# Patient Record
Sex: Female | Born: 1950
Health system: Southern US, Community
[De-identification: ages and names within clinical notes are randomized; demographics above are authoritative.]

## PROBLEM LIST (undated history)

## (undated) DIAGNOSIS — F419 Anxiety disorder, unspecified: Secondary | ICD-10-CM

## (undated) DIAGNOSIS — M1990 Unspecified osteoarthritis, unspecified site: Secondary | ICD-10-CM

## (undated) DIAGNOSIS — G8929 Other chronic pain: Secondary | ICD-10-CM

## (undated) DIAGNOSIS — C801 Malignant (primary) neoplasm, unspecified: Secondary | ICD-10-CM

## (undated) DIAGNOSIS — I10 Essential (primary) hypertension: Secondary | ICD-10-CM

## (undated) DIAGNOSIS — E7800 Pure hypercholesterolemia, unspecified: Secondary | ICD-10-CM

## (undated) DIAGNOSIS — I517 Cardiomegaly: Secondary | ICD-10-CM

## (undated) HISTORY — PX: KNEE SURGERY: SHX244

## (undated) HISTORY — PX: ABDOMINAL HYSTERECTOMY: SHX81

## (undated) HISTORY — PX: BACK SURGERY: SHX140

## (undated) HISTORY — PX: SHOULDER SURGERY: SHX246

---

## 2004-06-16 ENCOUNTER — Ambulatory Visit: Payer: Self-pay | Admitting: Pain Medicine

## 2004-06-24 ENCOUNTER — Ambulatory Visit: Payer: Self-pay | Admitting: Pain Medicine

## 2004-06-26 ENCOUNTER — Ambulatory Visit: Payer: Self-pay | Admitting: Pain Medicine

## 2004-07-14 ENCOUNTER — Ambulatory Visit: Payer: Self-pay | Admitting: Unknown Physician Specialty

## 2004-07-14 ENCOUNTER — Other Ambulatory Visit: Payer: Self-pay

## 2004-07-16 ENCOUNTER — Ambulatory Visit: Payer: Self-pay | Admitting: Unknown Physician Specialty

## 2004-08-06 ENCOUNTER — Ambulatory Visit: Payer: Self-pay | Admitting: Pain Medicine

## 2004-08-24 ENCOUNTER — Ambulatory Visit: Payer: Self-pay | Admitting: Pain Medicine

## 2004-08-31 ENCOUNTER — Ambulatory Visit: Payer: Self-pay | Admitting: Pain Medicine

## 2004-10-07 ENCOUNTER — Ambulatory Visit: Payer: Self-pay | Admitting: Pain Medicine

## 2004-10-19 ENCOUNTER — Ambulatory Visit: Payer: Self-pay | Admitting: Pain Medicine

## 2004-10-27 ENCOUNTER — Ambulatory Visit: Payer: Self-pay | Admitting: Pain Medicine

## 2004-10-28 ENCOUNTER — Ambulatory Visit: Payer: Self-pay | Admitting: Pain Medicine

## 2004-11-11 ENCOUNTER — Ambulatory Visit: Payer: Self-pay | Admitting: Pain Medicine

## 2004-12-09 ENCOUNTER — Ambulatory Visit: Payer: Self-pay | Admitting: Pain Medicine

## 2004-12-24 ENCOUNTER — Ambulatory Visit: Payer: Self-pay | Admitting: Pain Medicine

## 2004-12-30 ENCOUNTER — Ambulatory Visit: Payer: Self-pay | Admitting: Pain Medicine

## 2005-01-21 ENCOUNTER — Ambulatory Visit: Payer: Self-pay | Admitting: Pain Medicine

## 2005-02-03 ENCOUNTER — Ambulatory Visit: Payer: Self-pay | Admitting: Pain Medicine

## 2005-02-16 ENCOUNTER — Ambulatory Visit: Payer: Self-pay | Admitting: Pain Medicine

## 2005-03-16 ENCOUNTER — Ambulatory Visit: Payer: Self-pay | Admitting: Pain Medicine

## 2005-03-22 ENCOUNTER — Ambulatory Visit: Payer: Self-pay | Admitting: Pain Medicine

## 2005-04-13 ENCOUNTER — Ambulatory Visit: Payer: Self-pay | Admitting: Pain Medicine

## 2005-04-19 ENCOUNTER — Ambulatory Visit: Payer: Self-pay | Admitting: Pain Medicine

## 2005-04-21 ENCOUNTER — Ambulatory Visit: Payer: Self-pay | Admitting: Pain Medicine

## 2005-05-11 ENCOUNTER — Ambulatory Visit: Payer: Self-pay | Admitting: Pain Medicine

## 2005-05-17 ENCOUNTER — Ambulatory Visit: Payer: Self-pay | Admitting: Pain Medicine

## 2005-06-10 ENCOUNTER — Ambulatory Visit: Payer: Self-pay | Admitting: Pain Medicine

## 2005-07-06 ENCOUNTER — Ambulatory Visit: Payer: Self-pay | Admitting: Pain Medicine

## 2005-08-05 ENCOUNTER — Ambulatory Visit: Payer: Self-pay | Admitting: Pain Medicine

## 2005-08-24 ENCOUNTER — Ambulatory Visit: Payer: Self-pay | Admitting: Pain Medicine

## 2005-10-14 ENCOUNTER — Ambulatory Visit: Payer: Self-pay | Admitting: Pain Medicine

## 2005-11-08 ENCOUNTER — Ambulatory Visit: Payer: Self-pay | Admitting: Pain Medicine

## 2005-11-18 ENCOUNTER — Ambulatory Visit: Payer: Self-pay | Admitting: Pain Medicine

## 2005-12-01 ENCOUNTER — Ambulatory Visit: Payer: Self-pay | Admitting: Pain Medicine

## 2006-01-04 ENCOUNTER — Ambulatory Visit: Payer: Self-pay | Admitting: Pain Medicine

## 2006-02-01 ENCOUNTER — Ambulatory Visit: Payer: Self-pay | Admitting: Pain Medicine

## 2006-02-03 ENCOUNTER — Other Ambulatory Visit: Payer: Self-pay

## 2006-02-10 ENCOUNTER — Inpatient Hospital Stay: Payer: Self-pay | Admitting: General Practice

## 2006-04-07 ENCOUNTER — Ambulatory Visit: Payer: Self-pay | Admitting: Pain Medicine

## 2006-04-27 ENCOUNTER — Ambulatory Visit: Payer: Self-pay | Admitting: Pain Medicine

## 2006-05-05 ENCOUNTER — Ambulatory Visit: Payer: Self-pay | Admitting: Pain Medicine

## 2006-06-02 ENCOUNTER — Ambulatory Visit: Payer: Self-pay | Admitting: Pain Medicine

## 2006-07-05 ENCOUNTER — Ambulatory Visit: Payer: Self-pay | Admitting: Pain Medicine

## 2006-07-18 ENCOUNTER — Ambulatory Visit: Payer: Self-pay | Admitting: Pain Medicine

## 2006-08-03 ENCOUNTER — Ambulatory Visit: Payer: Self-pay | Admitting: Pain Medicine

## 2006-09-01 ENCOUNTER — Ambulatory Visit: Payer: Self-pay | Admitting: Pain Medicine

## 2006-09-29 ENCOUNTER — Ambulatory Visit: Payer: Self-pay | Admitting: Pain Medicine

## 2006-11-03 ENCOUNTER — Ambulatory Visit: Payer: Self-pay | Admitting: Pain Medicine

## 2006-11-09 ENCOUNTER — Ambulatory Visit: Payer: Self-pay | Admitting: Pain Medicine

## 2006-11-29 ENCOUNTER — Ambulatory Visit: Payer: Self-pay | Admitting: Pain Medicine

## 2006-12-07 ENCOUNTER — Ambulatory Visit: Payer: Self-pay | Admitting: Pain Medicine

## 2007-01-03 ENCOUNTER — Ambulatory Visit: Payer: Self-pay | Admitting: Pain Medicine

## 2007-02-02 ENCOUNTER — Ambulatory Visit: Payer: Self-pay | Admitting: Pain Medicine

## 2007-03-02 ENCOUNTER — Ambulatory Visit: Payer: Self-pay | Admitting: Pain Medicine

## 2007-03-02 ENCOUNTER — Emergency Department: Payer: Self-pay | Admitting: Emergency Medicine

## 2007-03-02 ENCOUNTER — Other Ambulatory Visit: Payer: Self-pay

## 2007-03-08 ENCOUNTER — Ambulatory Visit: Payer: Self-pay | Admitting: Emergency Medicine

## 2007-03-20 ENCOUNTER — Ambulatory Visit: Payer: Self-pay | Admitting: Pain Medicine

## 2007-04-04 ENCOUNTER — Ambulatory Visit: Payer: Self-pay | Admitting: Pain Medicine

## 2007-04-19 ENCOUNTER — Ambulatory Visit: Payer: Self-pay | Admitting: Pain Medicine

## 2007-05-04 ENCOUNTER — Ambulatory Visit: Payer: Self-pay | Admitting: Pain Medicine

## 2007-06-07 ENCOUNTER — Ambulatory Visit: Payer: Self-pay | Admitting: Pain Medicine

## 2007-06-24 ENCOUNTER — Emergency Department: Payer: Self-pay | Admitting: Unknown Physician Specialty

## 2007-07-11 ENCOUNTER — Ambulatory Visit: Payer: Self-pay | Admitting: Pain Medicine

## 2007-08-03 ENCOUNTER — Ambulatory Visit: Payer: Self-pay | Admitting: Pain Medicine

## 2007-08-29 ENCOUNTER — Ambulatory Visit: Payer: Self-pay | Admitting: Pain Medicine

## 2007-09-04 ENCOUNTER — Ambulatory Visit: Payer: Self-pay | Admitting: Pain Medicine

## 2007-10-05 ENCOUNTER — Ambulatory Visit: Payer: Self-pay | Admitting: Pain Medicine

## 2007-10-16 ENCOUNTER — Ambulatory Visit: Payer: Self-pay | Admitting: Pain Medicine

## 2007-10-31 ENCOUNTER — Ambulatory Visit: Payer: Self-pay | Admitting: Pain Medicine

## 2007-11-08 ENCOUNTER — Ambulatory Visit: Payer: Self-pay | Admitting: Pain Medicine

## 2007-11-15 ENCOUNTER — Ambulatory Visit: Payer: Self-pay | Admitting: Pain Medicine

## 2007-11-20 ENCOUNTER — Ambulatory Visit: Payer: Self-pay | Admitting: Pain Medicine

## 2007-12-05 ENCOUNTER — Ambulatory Visit: Payer: Self-pay | Admitting: Pain Medicine

## 2007-12-13 ENCOUNTER — Ambulatory Visit: Payer: Self-pay | Admitting: Pain Medicine

## 2008-01-01 ENCOUNTER — Encounter: Admission: RE | Admit: 2008-01-01 | Discharge: 2008-01-01 | Payer: Self-pay | Admitting: Neurological Surgery

## 2008-01-11 ENCOUNTER — Ambulatory Visit: Payer: Self-pay | Admitting: Pain Medicine

## 2008-02-08 ENCOUNTER — Ambulatory Visit: Payer: Self-pay | Admitting: Pain Medicine

## 2008-02-15 ENCOUNTER — Inpatient Hospital Stay (HOSPITAL_COMMUNITY): Admission: RE | Admit: 2008-02-15 | Discharge: 2008-02-17 | Payer: Self-pay | Admitting: Neurological Surgery

## 2008-03-19 ENCOUNTER — Encounter: Admission: RE | Admit: 2008-03-19 | Discharge: 2008-03-19 | Payer: Self-pay | Admitting: Neurological Surgery

## 2008-04-01 ENCOUNTER — Encounter: Admission: RE | Admit: 2008-04-01 | Discharge: 2008-04-01 | Payer: Self-pay | Admitting: Neurological Surgery

## 2008-05-28 ENCOUNTER — Encounter: Admission: RE | Admit: 2008-05-28 | Discharge: 2008-05-28 | Payer: Self-pay | Admitting: Neurological Surgery

## 2008-08-26 ENCOUNTER — Encounter: Admission: RE | Admit: 2008-08-26 | Discharge: 2008-08-26 | Payer: Self-pay | Admitting: Neurological Surgery

## 2008-12-02 ENCOUNTER — Encounter: Admission: RE | Admit: 2008-12-02 | Discharge: 2008-12-02 | Payer: Self-pay | Admitting: Neurological Surgery

## 2008-12-30 ENCOUNTER — Ambulatory Visit: Payer: Self-pay | Admitting: Cardiology

## 2009-03-04 ENCOUNTER — Ambulatory Visit: Payer: Self-pay | Admitting: Internal Medicine

## 2009-03-18 ENCOUNTER — Ambulatory Visit: Payer: Self-pay | Admitting: Pain Medicine

## 2009-03-26 ENCOUNTER — Ambulatory Visit: Payer: Self-pay | Admitting: Pain Medicine

## 2009-04-22 ENCOUNTER — Ambulatory Visit: Payer: Self-pay | Admitting: Pain Medicine

## 2009-04-30 ENCOUNTER — Ambulatory Visit: Payer: Self-pay | Admitting: Pain Medicine

## 2009-05-21 ENCOUNTER — Ambulatory Visit: Payer: Self-pay | Admitting: Pain Medicine

## 2009-06-19 ENCOUNTER — Ambulatory Visit: Payer: Self-pay | Admitting: Pain Medicine

## 2009-07-17 ENCOUNTER — Ambulatory Visit: Payer: Self-pay | Admitting: Pain Medicine

## 2009-07-30 ENCOUNTER — Ambulatory Visit: Payer: Self-pay | Admitting: Pain Medicine

## 2009-08-06 ENCOUNTER — Ambulatory Visit: Payer: Self-pay | Admitting: Pain Medicine

## 2009-08-18 ENCOUNTER — Ambulatory Visit: Payer: Self-pay | Admitting: Pain Medicine

## 2009-09-16 ENCOUNTER — Ambulatory Visit: Payer: Self-pay | Admitting: Pain Medicine

## 2009-10-20 ENCOUNTER — Ambulatory Visit: Payer: Self-pay | Admitting: Pain Medicine

## 2009-10-27 ENCOUNTER — Ambulatory Visit: Payer: Self-pay | Admitting: Pain Medicine

## 2009-11-18 ENCOUNTER — Ambulatory Visit: Payer: Self-pay | Admitting: Pain Medicine

## 2009-12-16 ENCOUNTER — Ambulatory Visit: Payer: Self-pay | Admitting: Pain Medicine

## 2010-01-20 ENCOUNTER — Ambulatory Visit: Payer: Self-pay | Admitting: Pain Medicine

## 2010-02-17 ENCOUNTER — Ambulatory Visit: Payer: Self-pay | Admitting: Pain Medicine

## 2010-02-19 IMAGING — CT CT HEAD WITHOUT CONTRAST
2 series · 16 of 30 positions shown, 20 images · non-contrast
Comparison: none

REASON FOR EXAM: headache
COMMENTS:

PROCEDURE:     CT  - CT HEAD WITHOUT CONTRAST  - June 24, 2007 [DATE]
RESULT:     Comparison: No available comparison exam.
Procedure: CT examination of the head was performed without intravenous
contrast. Collimation is 5 mm.

[Series 2: without · axial · non-contrast · 0.43mm/px · z∈[+1052,+1172]mm · 13 of 29 slices shown, 17 images]
[im 3/29  brain]
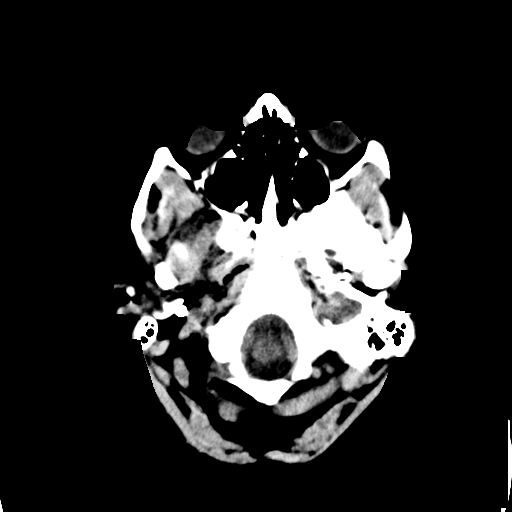
[im 3/29  bone]
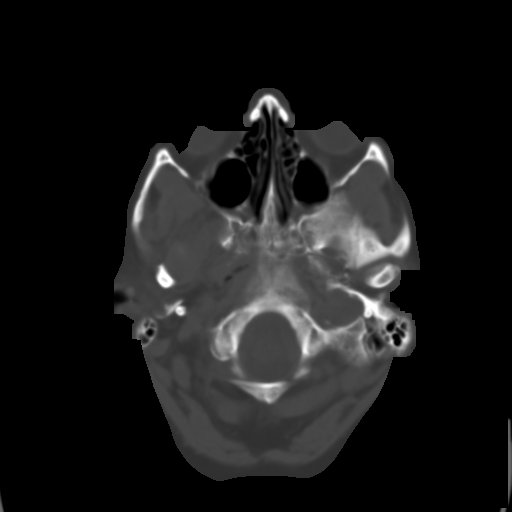
[im 5/29  brain]
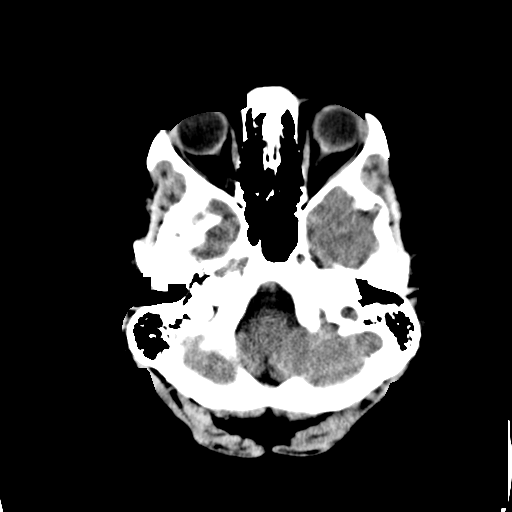
[im 7/29  brain]
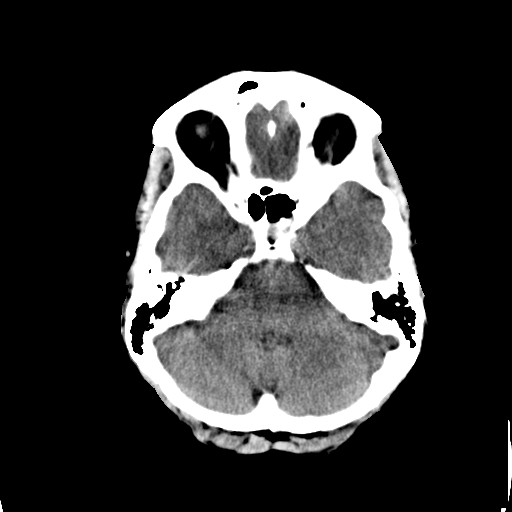
[im 9/29  brain]
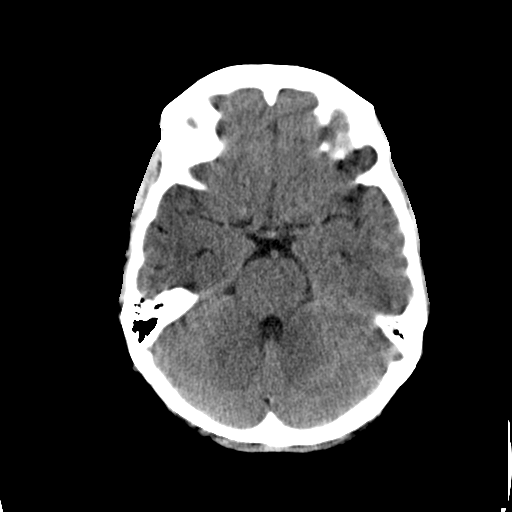
[im 11/29  brain]
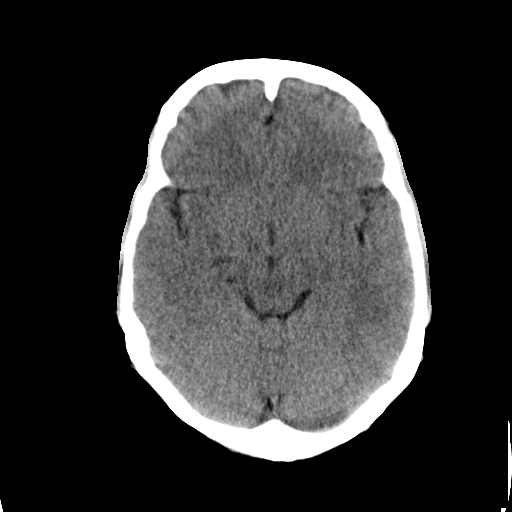
[im 11/29  bone]
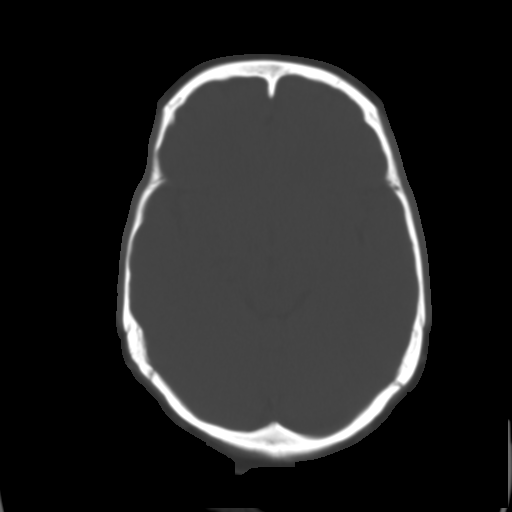
[im 13/29  brain]
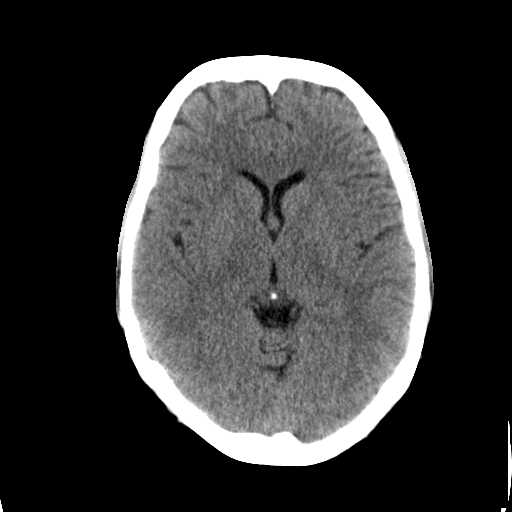
[im 15/29  brain]
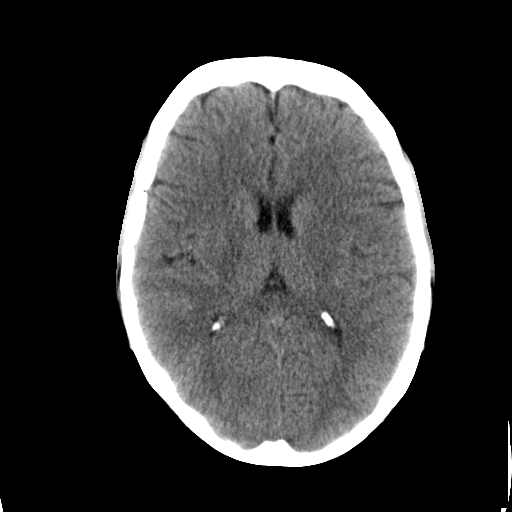
[im 17/29  brain]
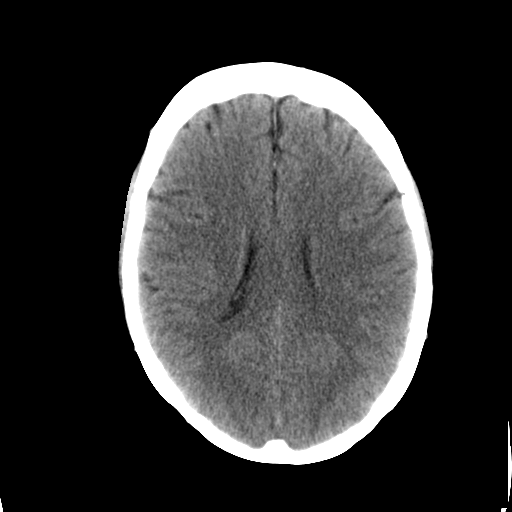
[im 19/29  brain]
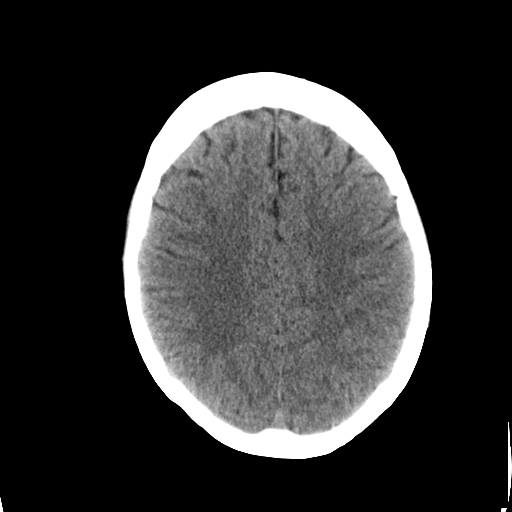
[im 19/29  bone]
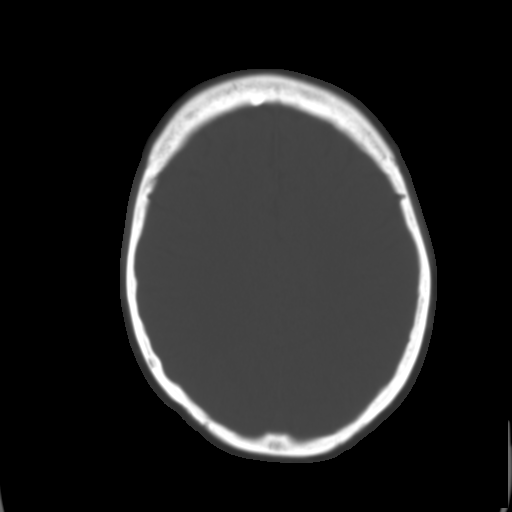
[im 21/29  brain]
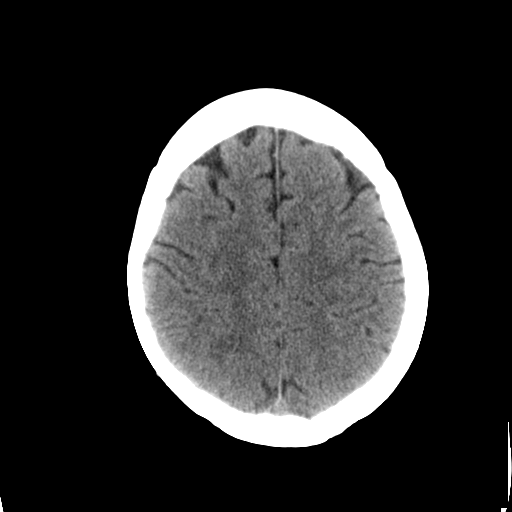
[im 23/29  brain]
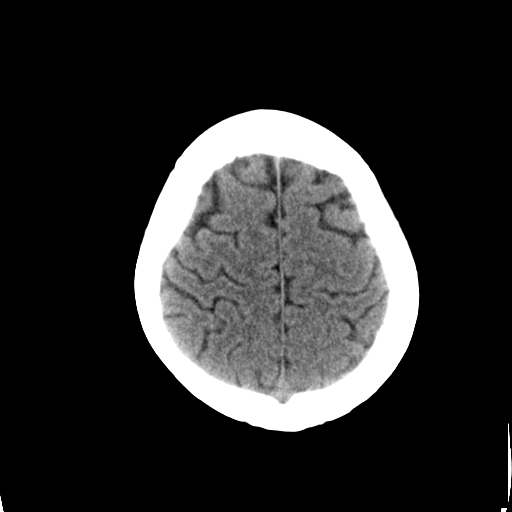
[im 25/29  brain]
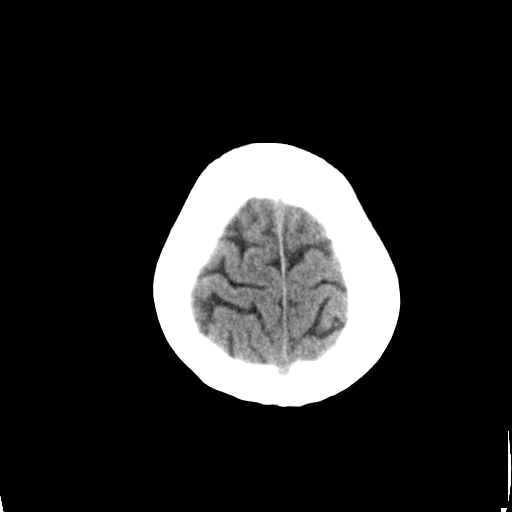
[im 27/29  brain]
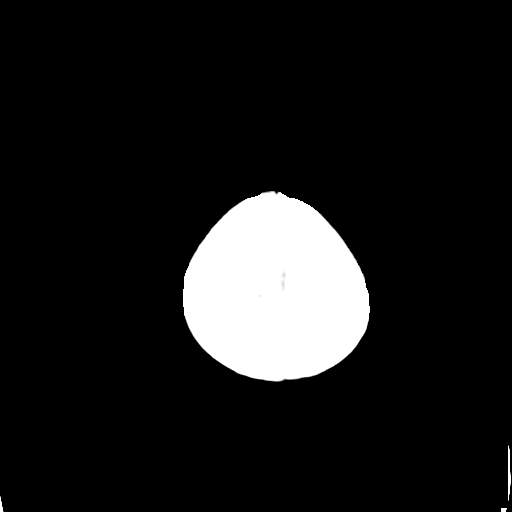
[im 27/29  bone]
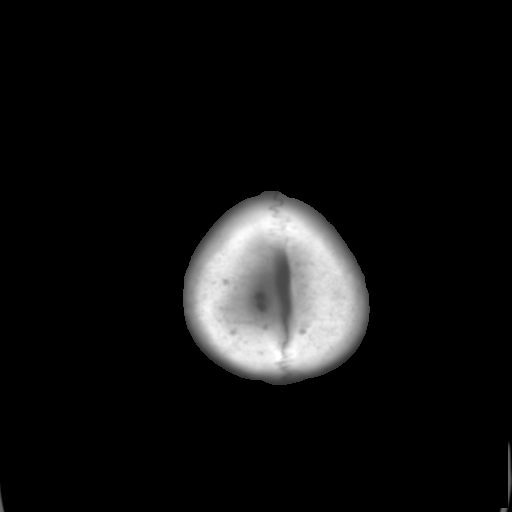

[Series 3: bone · axial · 0.43mm/px · z∈[+1052,+1092]mm · 3 of 29 slices shown]
[im 3/29  bone]
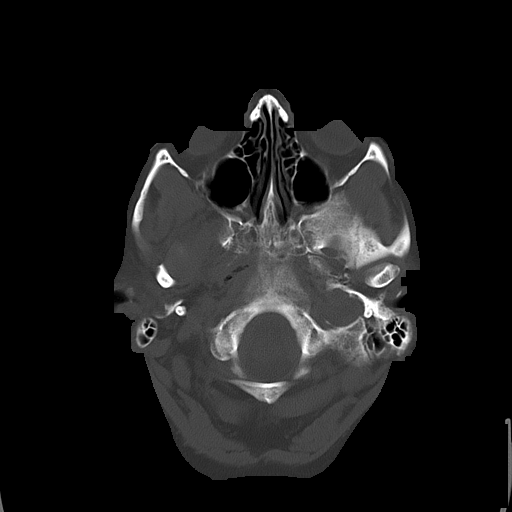
[im 7/29  bone]
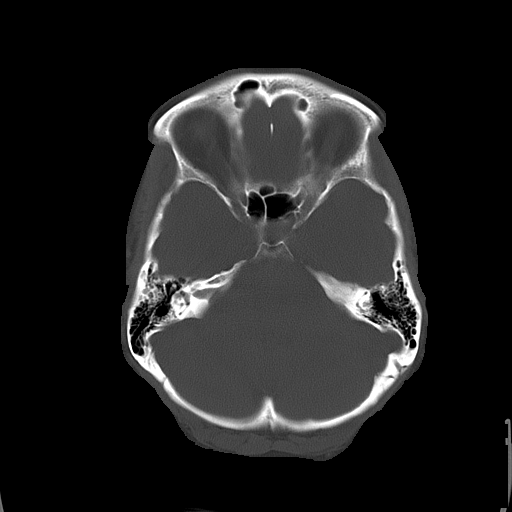
[im 11/29  bone]
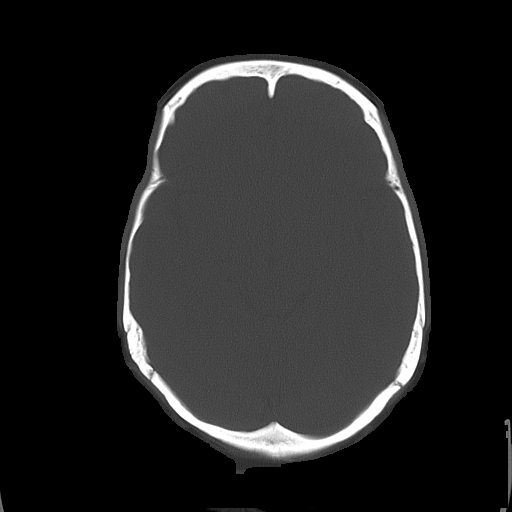

[16 of 30 positions shown; findings below may reference images not displayed]

FINDINGS: No evidence of intracranial hemorrhage, mass-effect, or ventricular
dilatation. The gray and white matters are differentiated. No displaced
calvarial fracture is noted. The partially visualized paranasal sinuses and
mastoid air cells are unremarkable. The left jugular bulb appears high
riding.
IMPRESSION: 1. No acute intracranial abnormality is noted.
2. The left jugular bulb appears high riding.

## 2010-03-17 ENCOUNTER — Ambulatory Visit: Payer: Self-pay | Admitting: Pain Medicine

## 2010-03-25 ENCOUNTER — Ambulatory Visit: Payer: Self-pay | Admitting: Pain Medicine

## 2010-04-07 ENCOUNTER — Ambulatory Visit: Payer: Self-pay | Admitting: Internal Medicine

## 2010-04-14 ENCOUNTER — Ambulatory Visit: Payer: Self-pay | Admitting: Pain Medicine

## 2010-05-11 LAB — TYPE AND SCREEN
ABO/RH(D): O NEG
Antibody Screen: NEGATIVE

## 2010-05-11 LAB — CBC
Hemoglobin: 14 g/dL (ref 12.0–15.0)
MCHC: 33.3 g/dL (ref 30.0–36.0)
MCHC: 34 g/dL (ref 30.0–36.0)
MCV: 90.2 fL (ref 78.0–100.0)
Platelets: 221 10*3/uL (ref 150–400)
RBC: 4.61 MIL/uL (ref 3.87–5.11)
RDW: 12.8 % (ref 11.5–15.5)
RDW: 12.9 % (ref 11.5–15.5)

## 2010-05-11 LAB — BASIC METABOLIC PANEL
CO2: 25 mEq/L (ref 19–32)
Calcium: 9.6 mg/dL (ref 8.4–10.5)
Creatinine, Ser: 0.77 mg/dL (ref 0.4–1.2)
GFR calc Af Amer: >60 mL/min (ref >60)
GFR calc non Af Amer: >60 mL/min (ref >60)
Glucose, Bld: 78 mg/dL (ref 70–99)
Sodium: 137 mEq/L (ref 135–145)

## 2010-05-11 LAB — DIFFERENTIAL
Basophils Absolute: 0.1 10*3/uL (ref 0.0–0.1)
Basophils Relative: 1 % (ref 0–1)
Eosinophils Absolute: 0.2 10*3/uL (ref 0.0–0.7)
Neutro Abs: 4.8 10*3/uL (ref 1.7–7.7)
Neutrophils Relative %: 50 % (ref 43–77)

## 2010-05-11 LAB — PROTIME-INR
INR: 1 (ref 0.00–1.49)
Prothrombin Time: 12.9 seconds (ref 11.6–15.2)

## 2010-05-11 LAB — APTT: aPTT: 25 seconds (ref 24–37)

## 2010-05-14 ENCOUNTER — Ambulatory Visit: Payer: Self-pay | Admitting: Pain Medicine

## 2010-06-09 NOTE — Op Note (Signed)
NAMEBITANIA, SHANKLAND NO.:  [id number]   MEDICAL RECORD NO.:  [id number]          PATIENT TYPE:  INP   LOCATION:  3003                         FACILITY:  [facility]   PHYSICIAN:  Tia Alert, MD     DATE OF BIRTH:  Apr 26, 1950   DATE OF PROCEDURE:  02/15/2008  DATE OF DISCHARGE:                               OPERATIVE REPORT   PREOPERATIVE DIAGNOSES:  1. Severe degenerative disk disease with spondylolisthesis and      stenosis L3-4.  2. Severe degenerative disk disease with stenosis L4-5.   POSTOPERATIVE DIAGNOSES:  1. Severe degenerative disk disease with spondylolisthesis and      stenosis L3-4.  2. Severe degenerative disk disease with stenosis L4-5.   PROCEDURES:  1. Anterior retroperitoneal interbody fusion at L3-4 and L4-5      utilizing a 50-mm PEEK interbody cage packed with Osteocel Plus.  2. Posterior interlaminar fusion at L3-4 and L4-5 utilizing Osteocel      Plus.  3. Interspinous plating at L3-4 and L4-5 utilizing the NuVasive      interspinous plate.   SURGEON:  Tia Alert, MD   ASSISTANT:  Reinaldo Meeker, MD   ANESTHESIA:  General endotracheal.   COMPLICATIONS:  None apparent.   INDICATIONS FOR PROCEDURE:  Ms. Geist is a 60 year old female who has  had a long history of back problems and has been pain management for  several years.  She was referred with severe back pain with progressive  bilateral leg pain.  She had an MRI and plain films which showed severe  degenerative disease at L3-4 and L4-5 with significant collapse of the  disk space with Modic endplate changes, and a grade 1 anterolisthesis of  L3 and L4.  She had stenosis at each level where set L3-4.  She had  tried to medical management for quite some time without significant  relief.  We recommended an anterior retroperitoneal interbody fusion at  L3-4 and L4-5 with interspinous plating.  She understood the risks,  benefits, expected outcome, and wished to  proceed.   DESCRIPTION OF PROCEDURE:  The patient was taken to the operating room,  and after induction of adequate general endotracheal anesthesia, she was  placed in the right lateral decubitus position.  She was monitored with  EMG monitoring.  She was positioned and then tapped into position.  Our  entry points were marked with AP and lateral fluoroscopy.  An extreme  lateral incision was made over the L3-4 interspace with a second  incision just posterior to this.  Blunt finger dissection was used to  dissect into the right retroperitoneal space until the transverse  process was palpated.  I was unable to passed the first dilator to the  psoas musculature.  We hooked this to EMG monitoring and then passed it  through the psoas to the disk space level and then used sequential  dilation attaching each dilator to the EMG monitoring.  Internal final  retractor was in place.  We then opened our retractor sweep away any  overlying psoas musculature, place our shim retractor  after monitoring  where the shim would be placed and then incised the disk space,  performed initial diskectomy with pituitary rongeurs, then used Cobb  dissector to dissect along each endplate and opened the contralateral  annulus.  We then able to distract the disk space to 8 mm and put our  trial into place.  We ordered an 8 mm x 50 mm cage.  We packed this cage  with Osteocel Plus and tapped this into position at L3-4.  We then  checked this with AP and lateral fluoroscopy and removed our retractor.  We then palpated into the retroperitoneal space again, made a new  incision of the L4-5 interspace and again used the same sequential  dilation and EMG monitoring to dilate the psoas musculature and expose  the L4-5 disk space.  We then placed our shim retractor after nerve  monitoring.  Once the retractor was opened, we then cut the disk space  sharply and then performed an initial diskectomy with pituitary  rongeurs,  then cleared the endplates with a Cobb dissector and opened  the contralateral annulus and then distracted the disk space once again  with the trials.  We then used another 8 x 50 mm PEEK interbody cage  packed with Osteocel and tapped this into position at L4-5.  I then  checked this with AP and lateral fluoroscopy and removed our retractor.  I then irrigated these incisions and closed these in layers of 2-0 and 3-  0 Vicryl.  Benzoin and Steri-Strips were placed on the skin and sterile  dressings were applied.  We then turned the patient into the prone  position and prepped her lumbar region with DuraPrep and draped in the  usual sterile fashion.  A 5 mL of local anesthesia was injected and a  dorsal midline incision was made and carried down to the lumbosacral  fascia.  The fascia was opened and the paraspinous musculature was taken  down in a subperiosteal fashion to expose L3-4 and L4-5.  Intraoperative  fluoroscopy confirmed my level and then drilled the lamina and laid a  mixture of Osteocel and Actifuse putty out over these to perform  interlaminar fusion posteriorly at L3-4 and L4-5, then used stackable  interspinous plates at N6-2 and L4-5 squeezed these into position and  then checked my construct with AP and lateral fluoroscopy.  We then  irrigated with saline solution containing bacitracin and placed a medium  Hemovac drain through a separate stab incision and then closed the  fascia with 0 Vicryl, closed the subcutaneous tissue with 2-0 Vicryl,  and the subcuticular tissue with 3-0 Vicryl.  The skin was closed with  Benzoin and Steri-Strips.  The drapes were removed.  Sterile dressing  was applied.  The patient was awakened from general anesthesia and  transferred to the recovery room in stable condition.  At end of the  procedure, all sponge, needle, and instrument counts were correct.      Tia Alert, MD  Electronically Signed     DSJ/MEDQ  D:  02/15/2008  T:   02/16/2008  Job:  [id number]

## 2010-06-11 ENCOUNTER — Ambulatory Visit: Payer: Self-pay | Admitting: Pain Medicine

## 2010-07-16 ENCOUNTER — Ambulatory Visit: Payer: Self-pay | Admitting: Pain Medicine

## 2010-08-12 ENCOUNTER — Ambulatory Visit: Payer: Self-pay | Admitting: Pain Medicine

## 2010-09-10 ENCOUNTER — Ambulatory Visit: Payer: Self-pay | Admitting: Pain Medicine

## 2010-10-13 ENCOUNTER — Ambulatory Visit: Payer: Self-pay | Admitting: Pain Medicine

## 2010-11-02 ENCOUNTER — Ambulatory Visit: Payer: Self-pay | Admitting: Pain Medicine

## 2010-11-12 ENCOUNTER — Ambulatory Visit: Payer: Self-pay | Admitting: Pain Medicine

## 2010-12-03 ENCOUNTER — Ambulatory Visit: Payer: Self-pay | Admitting: Emergency Medicine

## 2010-12-15 ENCOUNTER — Ambulatory Visit: Payer: Self-pay | Admitting: Pain Medicine

## 2011-01-06 ENCOUNTER — Ambulatory Visit: Payer: Self-pay | Admitting: Pain Medicine

## 2011-02-01 ENCOUNTER — Ambulatory Visit: Payer: Self-pay | Admitting: Pain Medicine

## 2011-02-11 ENCOUNTER — Ambulatory Visit: Payer: Self-pay | Admitting: Emergency Medicine

## 2011-02-11 LAB — CBC WITH DIFFERENTIAL/PLATELET
Basophil #: 0 10*3/uL (ref 0.0–0.1)
Eosinophil #: 0.1 10*3/uL (ref 0.0–0.7)
Lymphocyte %: 40.1 %
MCHC: 33.2 g/dL (ref 32.0–36.0)
Monocyte %: 8.3 %
Platelet: 216 10*3/uL (ref 150–440)
RDW: 13.1 % (ref 11.5–14.5)
WBC: 7.3 10*3/uL (ref 3.6–11.0)

## 2011-02-18 ENCOUNTER — Ambulatory Visit: Payer: Self-pay | Admitting: Emergency Medicine

## 2011-03-09 ENCOUNTER — Ambulatory Visit: Payer: Self-pay | Admitting: Pain Medicine

## 2011-04-12 ENCOUNTER — Ambulatory Visit: Payer: Self-pay | Admitting: Pain Medicine

## 2011-05-11 ENCOUNTER — Ambulatory Visit: Payer: Self-pay | Admitting: Pain Medicine

## 2011-06-09 ENCOUNTER — Ambulatory Visit: Payer: Self-pay | Admitting: Pain Medicine

## 2011-07-22 ENCOUNTER — Ambulatory Visit: Payer: Self-pay | Admitting: Internal Medicine

## 2011-07-22 LAB — CREATININE, SERUM
Creatinine: 0.89 mg/dL (ref 0.60–1.30)
EGFR (African American): >60
EGFR (Non-African Amer.): >60

## 2011-09-06 ENCOUNTER — Emergency Department: Payer: Self-pay | Admitting: Emergency Medicine

## 2012-07-04 ENCOUNTER — Ambulatory Visit: Payer: Self-pay | Admitting: General Practice

## 2012-07-04 LAB — BASIC METABOLIC PANEL
BUN: 23 mg/dL — ABNORMAL HIGH (ref 7–18)
Calcium, Total: 9.5 mg/dL (ref 8.5–10.1)
Chloride: 104 mmol/L (ref 98–107)
Co2: 31 mmol/L (ref 21–32)
EGFR (Non-African Amer.): >60
Potassium: 3.7 mmol/L (ref 3.5–5.1)

## 2012-07-04 LAB — URINALYSIS, COMPLETE
Bacteria: NONE SEEN
Bilirubin,UR: NEGATIVE
Glucose,UR: NEGATIVE mg/dL (ref 0–75)
Ketone: NEGATIVE
Nitrite: NEGATIVE
Protein: NEGATIVE
Squamous Epithelial: NONE SEEN

## 2012-07-04 LAB — CBC
HGB: 12.2 g/dL (ref 12.0–16.0)
MCHC: 33.7 g/dL (ref 32.0–36.0)
MCV: 89 fL (ref 80–100)
RBC: 4.04 10*6/uL (ref 3.80–5.20)
RDW: 13.2 % (ref 11.5–14.5)

## 2012-07-04 LAB — APTT: Activated PTT: 30 secs (ref 23.6–35.9)

## 2012-07-05 LAB — URINE CULTURE

## 2012-07-17 ENCOUNTER — Inpatient Hospital Stay: Payer: Self-pay | Admitting: General Practice

## 2012-07-18 LAB — BASIC METABOLIC PANEL
Anion Gap: 5 — ABNORMAL LOW (ref 7–16)
BUN: 18 mg/dL (ref 7–18)
EGFR (African American): >60
Glucose: 90 mg/dL (ref 65–99)
Potassium: 3.8 mmol/L (ref 3.5–5.1)

## 2012-07-18 LAB — PLATELET COUNT: Platelet: 167 10*3/uL (ref 150–440)

## 2012-07-19 LAB — BASIC METABOLIC PANEL
Anion Gap: 7 (ref 7–16)
BUN: 26 mg/dL — ABNORMAL HIGH (ref 7–18)
Co2: 27 mmol/L (ref 21–32)
Sodium: 136 mmol/L (ref 136–145)

## 2012-07-19 LAB — HEMOGLOBIN: HGB: 9.2 g/dL — ABNORMAL LOW (ref 12.0–16.0)

## 2012-10-09 ENCOUNTER — Emergency Department: Payer: Self-pay | Admitting: Emergency Medicine

## 2012-10-26 ENCOUNTER — Ambulatory Visit: Payer: Self-pay | Admitting: Pain Medicine

## 2012-12-09 ENCOUNTER — Emergency Department: Payer: Self-pay | Admitting: Internal Medicine

## 2012-12-13 ENCOUNTER — Ambulatory Visit: Payer: Self-pay | Admitting: Pain Medicine

## 2013-03-01 ENCOUNTER — Ambulatory Visit: Payer: Self-pay | Admitting: Otolaryngology

## 2013-04-09 ENCOUNTER — Ambulatory Visit: Payer: Self-pay | Admitting: Internal Medicine

## 2013-05-28 ENCOUNTER — Emergency Department: Payer: Self-pay | Admitting: Emergency Medicine

## 2013-05-28 LAB — COMPREHENSIVE METABOLIC PANEL
ALT: 46 U/L (ref 12–78)
ANION GAP: 5 — AB (ref 7–16)
AST: 45 U/L — AB (ref 15–37)
Albumin: 4.2 g/dL (ref 3.4–5.0)
Alkaline Phosphatase: 40 U/L — ABNORMAL LOW
BUN: 19 mg/dL — AB (ref 7–18)
Bilirubin,Total: 0.4 mg/dL (ref 0.2–1.0)
CALCIUM: 9.8 mg/dL (ref 8.5–10.1)
CHLORIDE: 106 mmol/L (ref 98–107)
Co2: 30 mmol/L (ref 21–32)
Creatinine: 0.81 mg/dL (ref 0.60–1.30)
EGFR (African American): >60
GLUCOSE: 115 mg/dL — AB (ref 65–99)
Osmolality: 284 (ref 275–301)
Potassium: 3.3 mmol/L — ABNORMAL LOW (ref 3.5–5.1)
SODIUM: 141 mmol/L (ref 136–145)
TOTAL PROTEIN: 7.7 g/dL (ref 6.4–8.2)

## 2013-05-28 LAB — URINALYSIS, COMPLETE
BLOOD: NEGATIVE
Bacteria: NONE SEEN
Bilirubin,UR: NEGATIVE
Glucose,UR: NEGATIVE mg/dL (ref 0–75)
KETONE: NEGATIVE
Leukocyte Esterase: NEGATIVE
Nitrite: NEGATIVE
PROTEIN: NEGATIVE
Ph: 5 (ref 4.5–8.0)
Specific Gravity: 1.017 (ref 1.003–1.030)
Squamous Epithelial: <1
WBC UR: <1 /HPF (ref 0–5)

## 2013-05-28 LAB — CBC
HCT: 38.1 % (ref 35.0–47.0)
HGB: 12.5 g/dL (ref 12.0–16.0)
MCH: 30.2 pg (ref 26.0–34.0)
MCHC: 33 g/dL (ref 32.0–36.0)
MCV: 92 fL (ref 80–100)
Platelet: 205 10*3/uL (ref 150–440)
RBC: 4.15 10*6/uL (ref 3.80–5.20)
RDW: 13.2 % (ref 11.5–14.5)
WBC: 6.8 10*3/uL (ref 3.6–11.0)

## 2013-07-03 ENCOUNTER — Ambulatory Visit: Payer: Self-pay | Admitting: Pain Medicine

## 2013-07-16 ENCOUNTER — Ambulatory Visit: Payer: Self-pay | Admitting: Pain Medicine

## 2013-07-31 ENCOUNTER — Ambulatory Visit: Payer: Self-pay | Admitting: Pain Medicine

## 2013-08-30 ENCOUNTER — Ambulatory Visit: Payer: Self-pay | Admitting: Pain Medicine

## 2013-09-05 ENCOUNTER — Ambulatory Visit: Payer: Self-pay | Admitting: Pain Medicine

## 2013-10-02 ENCOUNTER — Ambulatory Visit: Payer: Self-pay

## 2013-10-02 ENCOUNTER — Ambulatory Visit: Payer: Self-pay | Admitting: Pain Medicine

## 2013-11-07 ENCOUNTER — Ambulatory Visit: Payer: Self-pay | Admitting: Pain Medicine

## 2013-11-22 ENCOUNTER — Ambulatory Visit: Payer: Self-pay | Admitting: Pain Medicine

## 2013-12-06 ENCOUNTER — Ambulatory Visit: Payer: Self-pay | Admitting: Pain Medicine

## 2013-12-10 ENCOUNTER — Ambulatory Visit: Payer: Self-pay | Admitting: Pain Medicine

## 2014-01-03 ENCOUNTER — Ambulatory Visit: Payer: Self-pay | Admitting: Pain Medicine

## 2014-01-23 ENCOUNTER — Ambulatory Visit: Payer: Self-pay | Admitting: Pain Medicine

## 2014-01-28 ENCOUNTER — Ambulatory Visit: Payer: Self-pay | Admitting: Pain Medicine

## 2014-01-28 DIAGNOSIS — M4806 Spinal stenosis, lumbar region: Secondary | ICD-10-CM | POA: Diagnosis not present

## 2014-01-28 DIAGNOSIS — M47896 Other spondylosis, lumbar region: Secondary | ICD-10-CM | POA: Diagnosis not present

## 2014-01-28 DIAGNOSIS — M5126 Other intervertebral disc displacement, lumbar region: Secondary | ICD-10-CM | POA: Diagnosis not present

## 2014-02-25 DIAGNOSIS — I10 Essential (primary) hypertension: Secondary | ICD-10-CM | POA: Diagnosis not present

## 2014-02-25 DIAGNOSIS — E784 Other hyperlipidemia: Secondary | ICD-10-CM | POA: Diagnosis not present

## 2014-02-28 ENCOUNTER — Ambulatory Visit: Admit: 2014-02-28 | Disposition: A | Discharge status: Home / Self Care | Payer: Self-pay | Admitting: Internal Medicine

## 2014-02-28 ENCOUNTER — Ambulatory Visit: Payer: Self-pay | Admitting: Pain Medicine

## 2014-02-28 DIAGNOSIS — M5126 Other intervertebral disc displacement, lumbar region: Secondary | ICD-10-CM | POA: Diagnosis not present

## 2014-02-28 DIAGNOSIS — M47896 Other spondylosis, lumbar region: Secondary | ICD-10-CM | POA: Diagnosis not present

## 2014-02-28 DIAGNOSIS — F1120 Opioid dependence, uncomplicated: Secondary | ICD-10-CM | POA: Diagnosis not present

## 2014-02-28 DIAGNOSIS — G894 Chronic pain syndrome: Secondary | ICD-10-CM | POA: Diagnosis not present

## 2014-02-28 DIAGNOSIS — M5416 Radiculopathy, lumbar region: Secondary | ICD-10-CM | POA: Diagnosis not present

## 2014-02-28 DIAGNOSIS — M533 Sacrococcygeal disorders, not elsewhere classified: Secondary | ICD-10-CM | POA: Diagnosis not present

## 2014-02-28 DIAGNOSIS — Z79899 Other long term (current) drug therapy: Secondary | ICD-10-CM | POA: Diagnosis not present

## 2014-02-28 DIAGNOSIS — M47817 Spondylosis without myelopathy or radiculopathy, lumbosacral region: Secondary | ICD-10-CM | POA: Diagnosis not present

## 2014-02-28 DIAGNOSIS — M792 Neuralgia and neuritis, unspecified: Secondary | ICD-10-CM | POA: Diagnosis not present

## 2014-03-07 DIAGNOSIS — Z0000 Encounter for general adult medical examination without abnormal findings: Secondary | ICD-10-CM | POA: Diagnosis not present

## 2014-03-07 DIAGNOSIS — Z01419 Encounter for gynecological examination (general) (routine) without abnormal findings: Secondary | ICD-10-CM | POA: Diagnosis not present

## 2014-03-08 DIAGNOSIS — M75112 Incomplete rotator cuff tear or rupture of left shoulder, not specified as traumatic: Secondary | ICD-10-CM | POA: Diagnosis not present

## 2014-03-08 DIAGNOSIS — M25512 Pain in left shoulder: Secondary | ICD-10-CM | POA: Diagnosis not present

## 2014-03-14 DIAGNOSIS — M65812 Other synovitis and tenosynovitis, left shoulder: Secondary | ICD-10-CM | POA: Diagnosis not present

## 2014-03-14 DIAGNOSIS — M75122 Complete rotator cuff tear or rupture of left shoulder, not specified as traumatic: Secondary | ICD-10-CM | POA: Diagnosis not present

## 2014-03-28 ENCOUNTER — Ambulatory Visit: Payer: Self-pay | Admitting: Surgery

## 2014-03-28 DIAGNOSIS — Z01812 Encounter for preprocedural laboratory examination: Secondary | ICD-10-CM | POA: Diagnosis not present

## 2014-03-28 DIAGNOSIS — I10 Essential (primary) hypertension: Secondary | ICD-10-CM | POA: Diagnosis not present

## 2014-03-28 DIAGNOSIS — Z01810 Encounter for preprocedural cardiovascular examination: Secondary | ICD-10-CM | POA: Diagnosis not present

## 2014-04-02 ENCOUNTER — Ambulatory Visit: Payer: Self-pay | Admitting: Pain Medicine

## 2014-04-02 DIAGNOSIS — M47817 Spondylosis without myelopathy or radiculopathy, lumbosacral region: Secondary | ICD-10-CM | POA: Diagnosis not present

## 2014-04-02 DIAGNOSIS — M792 Neuralgia and neuritis, unspecified: Secondary | ICD-10-CM | POA: Diagnosis not present

## 2014-04-02 DIAGNOSIS — M5416 Radiculopathy, lumbar region: Secondary | ICD-10-CM | POA: Diagnosis not present

## 2014-04-02 DIAGNOSIS — M25512 Pain in left shoulder: Secondary | ICD-10-CM | POA: Diagnosis not present

## 2014-04-02 DIAGNOSIS — M533 Sacrococcygeal disorders, not elsewhere classified: Secondary | ICD-10-CM | POA: Diagnosis not present

## 2014-04-02 DIAGNOSIS — F329 Major depressive disorder, single episode, unspecified: Secondary | ICD-10-CM | POA: Diagnosis not present

## 2014-04-02 DIAGNOSIS — I10 Essential (primary) hypertension: Secondary | ICD-10-CM | POA: Diagnosis not present

## 2014-04-04 ENCOUNTER — Ambulatory Visit: Payer: Self-pay | Admitting: Surgery

## 2014-04-04 DIAGNOSIS — Z791 Long term (current) use of non-steroidal anti-inflammatories (NSAID): Secondary | ICD-10-CM | POA: Diagnosis not present

## 2014-04-04 DIAGNOSIS — M65812 Other synovitis and tenosynovitis, left shoulder: Secondary | ICD-10-CM | POA: Diagnosis not present

## 2014-04-04 DIAGNOSIS — Z885 Allergy status to narcotic agent status: Secondary | ICD-10-CM | POA: Diagnosis not present

## 2014-04-04 DIAGNOSIS — M75122 Complete rotator cuff tear or rupture of left shoulder, not specified as traumatic: Secondary | ICD-10-CM | POA: Diagnosis not present

## 2014-04-04 DIAGNOSIS — S46212A Strain of muscle, fascia and tendon of other parts of biceps, left arm, initial encounter: Secondary | ICD-10-CM | POA: Diagnosis not present

## 2014-04-04 DIAGNOSIS — M25512 Pain in left shoulder: Secondary | ICD-10-CM | POA: Diagnosis not present

## 2014-04-04 DIAGNOSIS — E785 Hyperlipidemia, unspecified: Secondary | ICD-10-CM | POA: Diagnosis not present

## 2014-04-04 DIAGNOSIS — I10 Essential (primary) hypertension: Secondary | ICD-10-CM | POA: Diagnosis not present

## 2014-04-04 DIAGNOSIS — Z79899 Other long term (current) drug therapy: Secondary | ICD-10-CM | POA: Diagnosis not present

## 2014-04-04 DIAGNOSIS — Z7982 Long term (current) use of aspirin: Secondary | ICD-10-CM | POA: Diagnosis not present

## 2014-04-04 DIAGNOSIS — M75102 Unspecified rotator cuff tear or rupture of left shoulder, not specified as traumatic: Secondary | ICD-10-CM | POA: Diagnosis not present

## 2014-04-04 DIAGNOSIS — Z880 Allergy status to penicillin: Secondary | ICD-10-CM | POA: Diagnosis not present

## 2014-04-04 DIAGNOSIS — G8918 Other acute postprocedural pain: Secondary | ICD-10-CM | POA: Diagnosis not present

## 2014-04-22 DIAGNOSIS — M25612 Stiffness of left shoulder, not elsewhere classified: Secondary | ICD-10-CM | POA: Diagnosis not present

## 2014-04-22 DIAGNOSIS — M25512 Pain in left shoulder: Secondary | ICD-10-CM | POA: Diagnosis not present

## 2014-04-22 DIAGNOSIS — Z9889 Other specified postprocedural states: Secondary | ICD-10-CM | POA: Diagnosis not present

## 2014-04-29 DIAGNOSIS — Z9889 Other specified postprocedural states: Secondary | ICD-10-CM | POA: Diagnosis not present

## 2014-04-29 DIAGNOSIS — M25612 Stiffness of left shoulder, not elsewhere classified: Secondary | ICD-10-CM | POA: Diagnosis not present

## 2014-04-29 DIAGNOSIS — M25512 Pain in left shoulder: Secondary | ICD-10-CM | POA: Diagnosis not present

## 2014-05-06 DIAGNOSIS — I10 Essential (primary) hypertension: Secondary | ICD-10-CM | POA: Diagnosis not present

## 2014-05-06 DIAGNOSIS — M25612 Stiffness of left shoulder, not elsewhere classified: Secondary | ICD-10-CM | POA: Diagnosis not present

## 2014-05-06 DIAGNOSIS — Z9889 Other specified postprocedural states: Secondary | ICD-10-CM | POA: Diagnosis not present

## 2014-05-06 DIAGNOSIS — M25512 Pain in left shoulder: Secondary | ICD-10-CM | POA: Diagnosis not present

## 2014-05-06 DIAGNOSIS — M6281 Muscle weakness (generalized): Secondary | ICD-10-CM | POA: Diagnosis not present

## 2014-05-13 DIAGNOSIS — M25612 Stiffness of left shoulder, not elsewhere classified: Secondary | ICD-10-CM | POA: Diagnosis not present

## 2014-05-13 DIAGNOSIS — M25512 Pain in left shoulder: Secondary | ICD-10-CM | POA: Diagnosis not present

## 2014-05-13 DIAGNOSIS — M6281 Muscle weakness (generalized): Secondary | ICD-10-CM | POA: Diagnosis not present

## 2014-05-13 DIAGNOSIS — Z9889 Other specified postprocedural states: Secondary | ICD-10-CM | POA: Diagnosis not present

## 2014-05-17 NOTE — Discharge Summary (Signed)
PATIENT NAME:  Sandra Dudley, KAREN MR#:  [id number] DATE OF BIRTH:  [birth year]  DATE OF ADMISSION:  07/17/2012 DATE OF DISCHARGE:  07/20/2012  ADMITTING DIAGNOSIS: Degenerative arthrosis of the left knee.   DISCHARGE DIAGNOSIS: Degenerative arthrosis of the left knee.   HISTORY: The patient is a 64 year old female who has been followed at [facility] for progression of left knee pain. The patient had reported a several year history of intermittent left knee pain. More recently the pain had increased, both in severity as well as in consistency. She was having difficulty with arising from a sitting position as well as ascending and descending stairs. The patient had localized most of the pain along the medial aspect of the knee. Her left knee pain was aggravated with weight-bearing activities. She also reported progression of pain to the point that it was significantly interfering with her activities of daily living. The patient had not seen any significant improvement in her condition despite anti-inflammatory medications, aspirin, as well as activity modification. X-rays taken in [place] showed narrowing of the medial cartilage space with bone-on-bone articulation noted as well as being associated with varus alignment. Osteophyte as well as subchondral sclerosis was noted. After discussion of the risks and benefits of surgical intervention, the patient expressed her understanding of the risks and benefits and agreed for plans for surgical intervention.   PROCEDURE: Left total knee arthroplasty using computer-assisted navigation.   ANESTHESIA: Femoral nerve block with spinal.   SOFT TISSUE RELEASE: Anterior cruciate ligament, posterior cruciate ligament, deep and superficial medial collateral ligaments, as well as the patellofemoral ligament.   IMPLANTS UTILIZED: DePuy PFC Sigma size 3 posterior stabilized femoral component (cemented), size 3 MBT tibial component (cemented), 35 mm  three-pegged oval dome patella (cemented), and a 12.5 mm stabilized rotating platform polyethylene insert.   HOSPITAL COURSE: The patient tolerated the procedure very well. She had no complications. She was then taken to PAC-U where she was stabilized and then transferred to the orthopedic floor. The patient began receiving anticoagulation therapy of Lovenox 30 mg subcu q. 12 hours per anesthesia and pharmacy protocol. She was fitted with TED stockings bilaterally. These were allowed to be removed 1 hour per 8 hour shift. The left one was applied on day 2 following removal of the Hemovac and dressing change. The patient was also fitted with the AV-I compression foot pumps bilaterally set at 80 mmHg.  Her calves have been nontender. There has been no evidence of any DVTs to the lower extremity. Negative Homans sign. The heels were elevated off the bed using rolled towels.   The patient has denied any chest pain or shortness of breath. Vital signs have been stable. She has been afebrile. Hemodynamically she was stable and no transfusions were given other than the Autovac transfusion given the first 6 hours postoperatively.   Physical therapy was initiated on day 1 for gait training and transfers. Upon being discharged was ambulating greater than 300 feet. She was able to go up and down 4 sets of steps. Had range of motion of 0 to 100 degrees. Occupational therapy was also initiated on day 1 for ADL and assistive devices. She has progressed very nicely and has had no complications throughout the hospital course.   DISPOSITION: The patient is being discharged to home in improved stable condition.   DISCHARGE INSTRUCTIONS:  1.  She may weight bear as tolerated. Continue using a walker until cleared by physical therapy to go to a quad cane.  She will receive home health PT.  2.  She is to continue using the Polar Care maintaining a temperature of 40 to 50 degrees Fahrenheit. She is to wear this around-the-clock  for the first 2 weeks.  3.  She is placed on a regular diet.  4.  Incentive spirometer q. 1 hour while awake.  5.  She has a follow-up appointment on 07/08 at 2:45. She is to call the clinic sooner if any temperatures of 101.5 or greater or any other problems. 6.  Will change the dressing as needed.   DRUG ALLERGIES: CODEINE, PENICILLIN.   DISCHARGE MEDICATIONS: The patient is to resume her regular medication that she was on prior to admission. She was given a prescription for Roxicodone 5 to 10 mg every 4 to 6 hours p.r.n. for pain, tramadol 50 to 100 mg q. 4 to 6 hours p.r.n. for pain and Lovenox 40 mg subcu q. day for 14 days and then discontinue and begin taking one 81 mg enteric-coated aspirin.   PAST MEDICAL HISTORY: 1.  Hypertension. 2.  Hyperlipidemia.  3.  Seasonal allergies. 4.  Depression and anxiety.  5.  Osteoporosis.  6.  Mitral valve regurgitation.  7.  Sleep apnea.  ____________________________ Vance Peper, PA jrw:sb D: 07/20/2012 07:17:25 ET T: 07/20/2012 07:40:21 ET JOB#: [id number]  cc: Vance Peper, PA, <Dictator> JON WOLFE PA ELECTRONICALLY SIGNED 07/21/2012 21:40

## 2014-05-17 NOTE — Op Note (Signed)
PATIENT NAME:  Sandra Dudley, Sandra Dudley MR#:  [id number] DATE OF BIRTH:  1950/02/27  DATE OF PROCEDURE:  07/17/2012  PREOPERATIVE DIAGNOSIS: Degenerative arthrosis of the left knee.   POSTOPERATIVE DIAGNOSIS: Degenerative arthrosis of the left knee.   PROCEDURE PERFORMED: Left total knee arthroplasty using computer-assisted navigation.   SURGEON: Dr. Skip Estimable.   ASSISTANT:  Vance Peper, PA (required to maintain retraction throughout the procedure).   ANESTHESIA: Femoral nerve block, and spinal.   ESTIMATED BLOOD LOSS: 50 mL.   FLUIDS REPLACED: 1400 mL of crystalloid.   TOURNIQUET TIME: 109 minutes.   DRAINS: Two medium drains to reinfusion system.   SOFT TISSUE RELEASES: Anterior cruciate ligament, posterior cruciate ligament, deep and superficial medial collateral ligament, patellofemoral ligament.   IMPLANTS UTILIZED: DePuy PFC Sigma size 3 posterior-stabilized femoral component (cemented), size 3 MBT tibial component (cemented), 35 mm 3-pegged oval dome patella (cemented), and a 12.5 mm stabilized rotating platform polyethylene insert.   INDICATIONS FOR SURGERY: The patient is a 64 year old female who has been seen for complaints of progressive left knee pain. X-rays demonstrated severe degenerative changes in tricompartmental fashion with relative varus deformity. The patient had previously undergone a successful right total knee arthroplasty.   After discussion of the risks and benefits of surgical intervention, the patient expressed understanding of the risks and benefits and agreed with plans for surgical intervention.   PROCEDURE IN DETAIL: The patient was brought into the operating room and after adequate femoral nerve block and spinal anesthesia was achieved, a tourniquet was placed on the patient's upper left thigh. The patient's left knee and leg were cleaned and prepped with alcohol and DuraPrep and draped in the usual sterile fashion. A "time-out" was performed as per usual  protocol. The left lower extremity was exsanguinated using an Esmarch, and the tourniquet was inflated to 300 mmHg. An anterior longitudinal incision was made followed by a standard mid-vastus approach. A moderate effusion was evacuated. The deep fibers of the medial collateral ligament were opened in a subperiosteal fashion off the medial flare of the tibia so as to maintain a continuous soft tissue sleeve. The patella was subluxed laterally and the patellofemoral ligament was incised. Inspection of the knee demonstrated severe degenerative changes in a tricompartmental fashion, with full-thickness loss of articular cartilage, most notably to the medial compartment. Prominent osteophytes were debrided using a rongeur. Anterior and posterior cruciate ligaments were excised. Two 4.0 mm Schanz pins were inserted into the femur and into the tibia for attachment of the array of trackers used for computer-assisted navigation. Hip center was then identified using a circumduction technique. Distal landmarks were mapped using the computer. Distal femur and proximal tibia were mapped using the computer. Distal femoral cutting guide was positioned using computer-assisted navigation so as to achieve a  5-degree distal valgus cut. Cut was performed and verified using the computer. Distal femur was sized, and it was felt that a size 3 femoral component was appropriate. A size 3 cutting guide was positioned, and the anterior cut was performed and verified using the computer. This was followed by completion of the posterior and chamfer cuts. Femoral cutting guide for the central box was then positioned and the central box cut was performed.   Attention was then directed to the proximal tibia. Medial and lateral menisci were excised. The extramedullary tibial cutting guide was positioned using computer-assisted navigation so as to achieve a 0-degree varus-valgus alignment and a 0 degree posterior slope. Cut was performed and  verified using the  computer. The proximal tibia was sized and it was felt that a size 3 tibial tray was appropriate. Tibial and femoral trials were inserted, followed by insertion of a 10 mm polyethylene insert. The knee was felt to be tight medially. A Cobb elevator was used to elevate the superficial fibers of the medial collateral ligament. The 10 mm polyethylene was replaced with a 12.5 mm polyethylene trial. This allowed for excellent medial and lateral soft tissue balancing, both in full extension and in flexion. Finally, the patella was cut and prepared so as to accommodate a 35 mm 3-pegged oval dome patella. Patellar trial was placed and the knee was placed through a range of motion, with excellent patellar tracking appreciated. The femoral component was removed. Central post hole for the tibial component was reamed, followed by insertion of a keel punch. Tibial trial was then removed. Cut surfaces of bone were irrigated with copious amounts of normal saline with antibiotic solution using pulsatile lavage and then suctioned dry. Polymethyl methacrylate cement was prepared in the usual fashion using a vacuum mixer. Cement was applied to the cut surface of the proximal tibia as well as along the undersurface of a size 3 MBT tibial component. The tibial component was positioned and impacted into place. Excess cement was removed using [employer] fast streamer. Cement was then applied to the cut surface of the femur as well as along the posterior flanges of a size 3 posterior-stabilized femoral component. Femoral component was positioned and impacted into place. Excess cement was removed using [employer] fast streamer. A 12.5 mm polyethylene trial was inserted and the knee was brought in full extension, with steady axial compression applied. Finally, cement was applied to the backside of a 35 mm three-pegged oval dome patella and the patellar component was positioned and patellar clamp applied. Excess cement was removed using  [employer] fast streamer.   After adequate curing of cement the tourniquet was deflated after a total tourniquet time of  109 minutes. Hemostasis was achieved using electrocautery. The knee was irrigated with copious amounts of normal saline with antibiotic solution using pulsatile lavage and then suctioned dry. The knee was inspected for any residual cement debris; 20 mL of 1.3% Exparel in 40 mL of injectable normal saline was then injected along the posterior capsule, medial and lateral gutters, and along the arthrotomy site. A 12.5 mm stabilized rotating-platform polyethylene insert was inserted and the knee was placed through a range of motion. Excellent medial and lateral soft tissue balancing was appreciated, both in full extension and in flexion. Excellent patellar tracking was appreciated. Two medium drains were placed in the wound bed and brought out through a separate stab incision to be attached to a reinfusion system. The medial parapatellar portion of the incision was reapproximated using interrupted sutures of #1 Vicryl.   Then, 30 mL of 0.25% Marcaine with epinephrine was injected along the subcutaneous tissue. The subcutaneous tissue was then reapproximated in layers, using first #0 Vicryl followed by  #2-0 Vicryl. Skin was closed with skin staples. A sterile dressing was applied.   The patient tolerated the procedure well. She was transported to the recovery room in stable condition.    ____________________________ Laurice Record. Holley Bouche., MD jph:dm D: 07/18/2012 00:23:11 ET T: 07/18/2012 28:36:62 ET JOB#: [id number]  cc: Laurice Record. Holley Bouche., MD, <Dictator> JAMES P Holley Bouche MD ELECTRONICALLY SIGNED 07/18/2012 20:39

## 2014-05-19 NOTE — Op Note (Signed)
PATIENT NAME:  Sandra Dudley, Sandra Dudley MR#:  [id number] DATE OF BIRTH:  08/14/1950  DATE OF PROCEDURE:  02/18/2011  PREOPERATIVE DIAGNOSIS: Ventral hernia on the lateral abdominal wall.   POSTOPERATIVE DIAGNOSIS: Ventral hernia on the lateral abdominal wall.  OPERATION: Repair of ventral hernia.   SURGEON: Vella Kohler, MD  INDICATIONS: This is an obese patient. She had surgery done on her back, and she says from that incision she is getting this hernia back. When she stands up she coughs and a big lump comes out. She was having a lot of pain, and a CAT scan of the abdomen and pelvis was done that showed a hernia very deep on top of the iliac crest and coming from the side of it.   DESCRIPTION OF PROCEDURE: The patient was then brought to surgery. She was put on her side. The lateral part of the abdominal wall was then opened with a knife. After cutting skin and subcutaneous tissue-the patient was very obese, so dissection was done way all the way down to the opening from where the either side muscle was then exposed. A hernia sac was then pushed back into the retroperitoneal space, and the defect was nice so I was able to close it with interrupted 0 Surgilon sutures. After that, irrigation of the abdomen was performed and irrigation of the wound was then performed, and subcuticular suturing with 3-0 Vicryl was done. The skin was closed with 4-0 nylon. The patient tolerated the procedure well and was sent to the recovery room in satisfactory condition.    ____________________________ Welford Roche Phylis Bougie, MD msh:cbb D: 02/18/2011 11:53:28 ET T: 02/18/2011 12:09:20 ET JOB#: [id number]  cc: Hiroki Wint S. Phylis Bougie, MD, <Dictator> Cletis Athens, MD Sharene Butters MD ELECTRONICALLY SIGNED 02/23/2011 13:03

## 2014-05-19 NOTE — Discharge Summary (Signed)
PATIENT NAME:  Sandra Dudley, HINE MR#:  [id number] DATE OF BIRTH:  1950/11/11  DATE OF ADMISSION:  02/18/2011 DATE OF DISCHARGE:  02/19/2011  HOSPITAL COURSE: This patient had surgery performed 02/18/2011 and patient had a large lumbar hernia which was then fixed. She was admitted for observation because of pain control. She was then discharged from the hospital on 02/19/2011 in satisfactory condition. At this time she is on pain medications for her home: Percocet 1 tablet q.4-6 hours p.r.n. pain and she is doing very well. She will be followed in my office for removal of stitches.   ____________________________ Welford Roche. Phylis Bougie, MD msh:cms D: 02/19/2011 09:42:55 ET T: 02/19/2011 11:36:43 ET JOB#: [id number]  cc: Addalynne Golding S. Phylis Bougie, MD, <Dictator> Sharene Butters MD ELECTRONICALLY SIGNED 02/23/2011 13:03

## 2014-05-20 DIAGNOSIS — M25612 Stiffness of left shoulder, not elsewhere classified: Secondary | ICD-10-CM | POA: Diagnosis not present

## 2014-05-20 DIAGNOSIS — Z9889 Other specified postprocedural states: Secondary | ICD-10-CM | POA: Diagnosis not present

## 2014-05-20 DIAGNOSIS — M25512 Pain in left shoulder: Secondary | ICD-10-CM | POA: Diagnosis not present

## 2014-05-20 DIAGNOSIS — M6281 Muscle weakness (generalized): Secondary | ICD-10-CM | POA: Diagnosis not present

## 2014-05-23 DIAGNOSIS — Z9889 Other specified postprocedural states: Secondary | ICD-10-CM | POA: Diagnosis not present

## 2014-05-23 DIAGNOSIS — M6281 Muscle weakness (generalized): Secondary | ICD-10-CM | POA: Diagnosis not present

## 2014-05-23 DIAGNOSIS — M25612 Stiffness of left shoulder, not elsewhere classified: Secondary | ICD-10-CM | POA: Diagnosis not present

## 2014-05-23 DIAGNOSIS — M25512 Pain in left shoulder: Secondary | ICD-10-CM | POA: Diagnosis not present

## 2014-05-26 NOTE — Op Note (Signed)
PATIENT NAME:  Sandra Dudley, Sandra Dudley MR#:  [id number] DATE OF BIRTH:  [birth year]  DATE OF PROCEDURE:  04/04/2014  PREOPERATIVE DIAGNOSIS: Massive rotator cuff tear, left shoulder.   POSTOPERATIVE DIAGNOSIS: Massive rotator cuff tear, left shoulder.   PROCEDURE: Arthroscopic debridement, arthroscopic subacromial decompression, and mini-open repair of massive rotator cuff tear, left shoulder.   SURGEON:  Pascal Lux, MD   ANESTHESIA: General endotracheal with an interscalene block placed preoperative by the anesthesiologist.   FINDINGS: As noted above. There was extensive degenerative labral fraying involving the anterior and superior portions of the labrum. The biceps tendon had ruptured and retracted chronically. There was complete tear of the superior two-thirds of the subscapularis tendon with retraction to the glenoid. In addition, there was a complete tear of the supraspinatus extending into this infraspinatus, also with retraction. There were grade 1 chondromalacial changes on both the glenoid and humerus.   COMPLICATIONS: None.   ESTIMATED BLOOD LOSS: Minimal.   TOTAL FLUIDS: 1100 mL.   TOURNIQUET: None.   DRAINS: None.   CLOSURE: Staples.   BRIEF CLINICAL NOTE: The patient is a 64 year old female with a  6+ month history of left shoulder pain. Her symptoms have persisted despite medications, activity modification, injection, etc. Her history and examination are consistent with a large rotator cuff tear, confirmed by MRI scan. She presents at this time for arthroscopy, decompression, and repair of the massive rotator cuff tear.   DESCRIPTION OF PROCEDURE: The patient was brought into the operating room and lain in the supine position. After adequate IV sedation was achieved, an interscalene block was placed by the anesthesiologist. She  then underwent general endotracheal intubation and anesthesia before being repositioned in the beach chair position using the beach chair  positioner. The left shoulder and upper extremity were prepped with ChloraPrep solution before being draped sterilely. Preoperative antibiotics were administered. The expected portal sites and incision site were injected with  0.5% Sensorcaine with epinephrine before the camera was placed in the posterior portal. The glenohumeral joint was thoroughly inspected with the findings as described above. Abundant reactive synovial tissues anteriorly, superiorly, and posteriorly were debrided using the full radius resector. In addition, the frayed margins of the torn portions of the rotator cuff also were debrided back to stable margins using the full radius resector. Finally, the  areas of degenerative tearing  and fraying of the labrum were debrided back to stable margins using the full radius resector. The margins of the labrum were then annealed  using the ArthroCare wand. The instruments were removed from the joint after suctioning the excess fluid.   The camera was repositioned through the posterior portal into the subacromial space. A separate lateral portal was created and the full radius resector introduced to perform a subtotal bursectomy. The ArthroCare wand was inserted and used to remove the periosteal tissues off the undersurface of the anterior third of the acromion as well as to recess the coracoacromial ligament from its attachment along the anterior and lateral margins of the acromion. The 4 mm acromionizing bur was then inserted  in order to complete the decompression by removing the undersurface of the anterior third of the acromion. The hemostasis was achieved using the ArthroCare wand. The instruments were removed from the subacromial space after suctioning the excess fluid.   An approximately 5-6 cm incision was made over the anterolateral aspect of the shoulder, beginning at the anterolateral corner of the acromion extending distally parallel to the biceps tendon. The incision was carried  down  through the subcutaneous tissues to expose the deltoid fascia. The raphe between the anterior and middle thirds was identified and this plane developed to provide access into the subacromial space. Additional bursal tissues were debrided sharply with  Metzenbaum scissors before the rotator cuff was inspected. There were complete  tears of the supraspinatus and subscapularis tendon with retraction of the subscapularis more so than the supraspinatus. Tagging stitches were placed in the margins of these tendons and used to advance the tendon laterally. The extensive releases were performed in order to better mobilize the tissues. Once adequately mobilized, the subscapularis tendon was reapproximated to the lesser tuberosity using 2 BioMet 2.9 mm JuggerKnot anchors. Both sets of sutures were passed through the tendon and left to be tied later. The supraspinatus tendon also was reapproximated to the greater tuberosity using two additional Biomet 2.9 mm JuggerKnot anchors. Again,  all sutures were passed through the torn portion of the rotator cuff and pulled laterally to be sure that a good repair could be achieved. Once all the sutures were placed, the sutures were tied sequentially to affect the repair. Several of these sutures were then brought out laterally through bone tunnels and tied over bone bridges to further reinforce the repair, creating a two layer closure. An apparent watertight closure was obtained. In addition, a  #0 Ethibond suture was placed in a side-to-side fashion to repair the short  longitudinal tear posteriorly. An apparent watertight closure was obtained.   The wound was copiously irrigated with sterile saline solution before the deltoid raphe was reapproximated using 2-0 Vicryl interrupted sutures. The subcutaneous tissues were closed in two layers using 2-0 Vicryl interrupted sutures before the skin was closed using staples. The portal sites also were closed using staples. A sterile bulky  dressing was applied to the shoulder before the arm was placed in a shoulder immobilizer. The patient was then awakened, extubated, and returned to the recovery room in satisfactory condition after tolerating the procedure well.     ____________________________ J. Dorien Chihuahua, MD jjp:tr D: 04/04/2014 10:22:11 ET T: 04/04/2014 11:50:14 ET JOB#: [id number]  cc: Pascal Lux, MD, <Dictator> Pascal Lux MD ELECTRONICALLY SIGNED 04/09/2014 15:04

## 2014-05-27 DIAGNOSIS — Z9889 Other specified postprocedural states: Secondary | ICD-10-CM | POA: Diagnosis not present

## 2014-05-27 DIAGNOSIS — M25612 Stiffness of left shoulder, not elsewhere classified: Secondary | ICD-10-CM | POA: Diagnosis not present

## 2014-05-27 DIAGNOSIS — M25512 Pain in left shoulder: Secondary | ICD-10-CM | POA: Diagnosis not present

## 2014-05-27 DIAGNOSIS — M6281 Muscle weakness (generalized): Secondary | ICD-10-CM | POA: Diagnosis not present

## 2014-05-30 DIAGNOSIS — M6281 Muscle weakness (generalized): Secondary | ICD-10-CM | POA: Diagnosis not present

## 2014-05-30 DIAGNOSIS — M25612 Stiffness of left shoulder, not elsewhere classified: Secondary | ICD-10-CM | POA: Diagnosis not present

## 2014-05-30 DIAGNOSIS — M25512 Pain in left shoulder: Secondary | ICD-10-CM | POA: Diagnosis not present

## 2014-05-30 DIAGNOSIS — Z9889 Other specified postprocedural states: Secondary | ICD-10-CM | POA: Diagnosis not present

## 2014-06-03 DIAGNOSIS — S43429A Sprain of unspecified rotator cuff capsule, initial encounter: Secondary | ICD-10-CM | POA: Diagnosis not present

## 2014-06-03 DIAGNOSIS — M76891 Other specified enthesopathies of right lower limb, excluding foot: Secondary | ICD-10-CM | POA: Diagnosis not present

## 2014-06-03 DIAGNOSIS — M25559 Pain in unspecified hip: Secondary | ICD-10-CM | POA: Diagnosis not present

## 2014-06-03 DIAGNOSIS — K219 Gastro-esophageal reflux disease without esophagitis: Secondary | ICD-10-CM | POA: Diagnosis not present

## 2014-06-04 DIAGNOSIS — M6281 Muscle weakness (generalized): Secondary | ICD-10-CM | POA: Diagnosis not present

## 2014-06-04 DIAGNOSIS — Z9889 Other specified postprocedural states: Secondary | ICD-10-CM | POA: Diagnosis not present

## 2014-06-04 DIAGNOSIS — M65812 Other synovitis and tenosynovitis, left shoulder: Secondary | ICD-10-CM | POA: Diagnosis not present

## 2014-06-04 DIAGNOSIS — M25512 Pain in left shoulder: Secondary | ICD-10-CM | POA: Diagnosis not present

## 2014-06-06 DIAGNOSIS — M6281 Muscle weakness (generalized): Secondary | ICD-10-CM | POA: Diagnosis not present

## 2014-06-06 DIAGNOSIS — M25512 Pain in left shoulder: Secondary | ICD-10-CM | POA: Diagnosis not present

## 2014-06-06 DIAGNOSIS — M25612 Stiffness of left shoulder, not elsewhere classified: Secondary | ICD-10-CM | POA: Diagnosis not present

## 2014-06-06 DIAGNOSIS — Z9889 Other specified postprocedural states: Secondary | ICD-10-CM | POA: Diagnosis not present

## 2014-06-10 DIAGNOSIS — M6281 Muscle weakness (generalized): Secondary | ICD-10-CM | POA: Diagnosis not present

## 2014-06-10 DIAGNOSIS — M25512 Pain in left shoulder: Secondary | ICD-10-CM | POA: Diagnosis not present

## 2014-06-10 DIAGNOSIS — Z9889 Other specified postprocedural states: Secondary | ICD-10-CM | POA: Diagnosis not present

## 2014-06-10 DIAGNOSIS — M25612 Stiffness of left shoulder, not elsewhere classified: Secondary | ICD-10-CM | POA: Diagnosis not present

## 2014-07-08 ENCOUNTER — Ambulatory Visit
Admission: RE | Admit: 2014-07-08 | Discharge: 2014-07-08 | Disposition: A | Discharge status: Home / Self Care | Payer: Commercial Managed Care - HMO | Source: Ambulatory Visit | Attending: Internal Medicine | Admitting: Internal Medicine

## 2014-07-08 ENCOUNTER — Other Ambulatory Visit: Payer: Self-pay | Admitting: Internal Medicine

## 2014-07-08 DIAGNOSIS — M79606 Pain in leg, unspecified: Secondary | ICD-10-CM | POA: Diagnosis not present

## 2014-07-08 DIAGNOSIS — M25552 Pain in left hip: Secondary | ICD-10-CM | POA: Insufficient documentation

## 2014-07-08 DIAGNOSIS — M21852 Other specified acquired deformities of left thigh: Secondary | ICD-10-CM | POA: Diagnosis not present

## 2014-07-08 DIAGNOSIS — M7612 Psoas tendinitis, left hip: Secondary | ICD-10-CM | POA: Diagnosis not present

## 2014-07-09 DIAGNOSIS — M76891 Other specified enthesopathies of right lower limb, excluding foot: Secondary | ICD-10-CM | POA: Diagnosis not present

## 2014-07-09 DIAGNOSIS — M7612 Psoas tendinitis, left hip: Secondary | ICD-10-CM | POA: Diagnosis not present

## 2014-07-09 DIAGNOSIS — K219 Gastro-esophageal reflux disease without esophagitis: Secondary | ICD-10-CM | POA: Diagnosis not present

## 2014-07-09 DIAGNOSIS — M25559 Pain in unspecified hip: Secondary | ICD-10-CM | POA: Diagnosis not present

## 2014-07-26 DIAGNOSIS — M75122 Complete rotator cuff tear or rupture of left shoulder, not specified as traumatic: Secondary | ICD-10-CM | POA: Diagnosis not present

## 2014-07-26 DIAGNOSIS — M65812 Other synovitis and tenosynovitis, left shoulder: Secondary | ICD-10-CM | POA: Diagnosis not present

## 2014-09-02 DIAGNOSIS — M25569 Pain in unspecified knee: Secondary | ICD-10-CM | POA: Diagnosis not present

## 2014-09-02 DIAGNOSIS — K219 Gastro-esophageal reflux disease without esophagitis: Secondary | ICD-10-CM | POA: Diagnosis not present

## 2014-09-02 DIAGNOSIS — S43429A Sprain of unspecified rotator cuff capsule, initial encounter: Secondary | ICD-10-CM | POA: Diagnosis not present

## 2014-09-02 DIAGNOSIS — M76891 Other specified enthesopathies of right lower limb, excluding foot: Secondary | ICD-10-CM | POA: Diagnosis not present

## 2014-10-02 DIAGNOSIS — R350 Frequency of micturition: Secondary | ICD-10-CM | POA: Diagnosis not present

## 2014-10-02 DIAGNOSIS — K219 Gastro-esophageal reflux disease without esophagitis: Secondary | ICD-10-CM | POA: Diagnosis not present

## 2014-10-02 DIAGNOSIS — I119 Hypertensive heart disease without heart failure: Secondary | ICD-10-CM | POA: Diagnosis not present

## 2014-10-02 DIAGNOSIS — M76891 Other specified enthesopathies of right lower limb, excluding foot: Secondary | ICD-10-CM | POA: Diagnosis not present

## 2014-10-02 DIAGNOSIS — R0789 Other chest pain: Secondary | ICD-10-CM | POA: Diagnosis not present

## 2014-10-03 DIAGNOSIS — Z23 Encounter for immunization: Secondary | ICD-10-CM | POA: Diagnosis not present

## 2014-10-08 DIAGNOSIS — M76891 Other specified enthesopathies of right lower limb, excluding foot: Secondary | ICD-10-CM | POA: Diagnosis not present

## 2014-10-08 DIAGNOSIS — K219 Gastro-esophageal reflux disease without esophagitis: Secondary | ICD-10-CM | POA: Diagnosis not present

## 2014-10-08 DIAGNOSIS — R079 Chest pain, unspecified: Secondary | ICD-10-CM | POA: Diagnosis not present

## 2014-10-10 DIAGNOSIS — M76891 Other specified enthesopathies of right lower limb, excluding foot: Secondary | ICD-10-CM | POA: Diagnosis not present

## 2014-10-10 DIAGNOSIS — K219 Gastro-esophageal reflux disease without esophagitis: Secondary | ICD-10-CM | POA: Diagnosis not present

## 2014-10-10 DIAGNOSIS — R079 Chest pain, unspecified: Secondary | ICD-10-CM | POA: Diagnosis not present

## 2014-11-13 DIAGNOSIS — E784 Other hyperlipidemia: Secondary | ICD-10-CM | POA: Diagnosis not present

## 2014-11-13 DIAGNOSIS — K219 Gastro-esophageal reflux disease without esophagitis: Secondary | ICD-10-CM | POA: Diagnosis not present

## 2014-11-13 DIAGNOSIS — M7612 Psoas tendinitis, left hip: Secondary | ICD-10-CM | POA: Diagnosis not present

## 2014-11-13 DIAGNOSIS — M791 Myalgia: Secondary | ICD-10-CM | POA: Diagnosis not present

## 2014-12-12 DIAGNOSIS — M791 Myalgia: Secondary | ICD-10-CM | POA: Diagnosis not present

## 2014-12-12 DIAGNOSIS — M609 Myositis, unspecified: Secondary | ICD-10-CM | POA: Diagnosis not present

## 2014-12-12 DIAGNOSIS — E784 Other hyperlipidemia: Secondary | ICD-10-CM | POA: Diagnosis not present

## 2014-12-12 DIAGNOSIS — K219 Gastro-esophageal reflux disease without esophagitis: Secondary | ICD-10-CM | POA: Diagnosis not present

## 2014-12-12 DIAGNOSIS — F4001 Agoraphobia with panic disorder: Secondary | ICD-10-CM | POA: Diagnosis not present

## 2015-01-30 DIAGNOSIS — M1990 Unspecified osteoarthritis, unspecified site: Secondary | ICD-10-CM | POA: Diagnosis not present

## 2015-02-27 DIAGNOSIS — I119 Hypertensive heart disease without heart failure: Secondary | ICD-10-CM | POA: Diagnosis not present

## 2015-02-27 DIAGNOSIS — I10 Essential (primary) hypertension: Secondary | ICD-10-CM | POA: Diagnosis not present

## 2015-02-27 DIAGNOSIS — E784 Other hyperlipidemia: Secondary | ICD-10-CM | POA: Diagnosis not present

## 2015-02-27 DIAGNOSIS — K5732 Diverticulitis of large intestine without perforation or abscess without bleeding: Secondary | ICD-10-CM | POA: Diagnosis not present

## 2015-03-15 IMAGING — CR DG KNEE 1-2V*L*
1 series · 3 of 3 positions shown · non-contrast
Comparison: none

REASON FOR EXAM: post op
COMMENTS:   Bedside (portable):Y

PROCEDURE:     DXR - DXR KNEE LEFT AP AND LATERAL  - July 17, 2012  [DATE]
RESULT:     Left knee images demonstrate the patient status post left knee
arthroplasty. There is no immediate postoperative bone or hardware
complication. Alignment is maintained.

[Series 1: ap · 0.17mm/px · 3 of 3 slices shown]
[im 1/3]
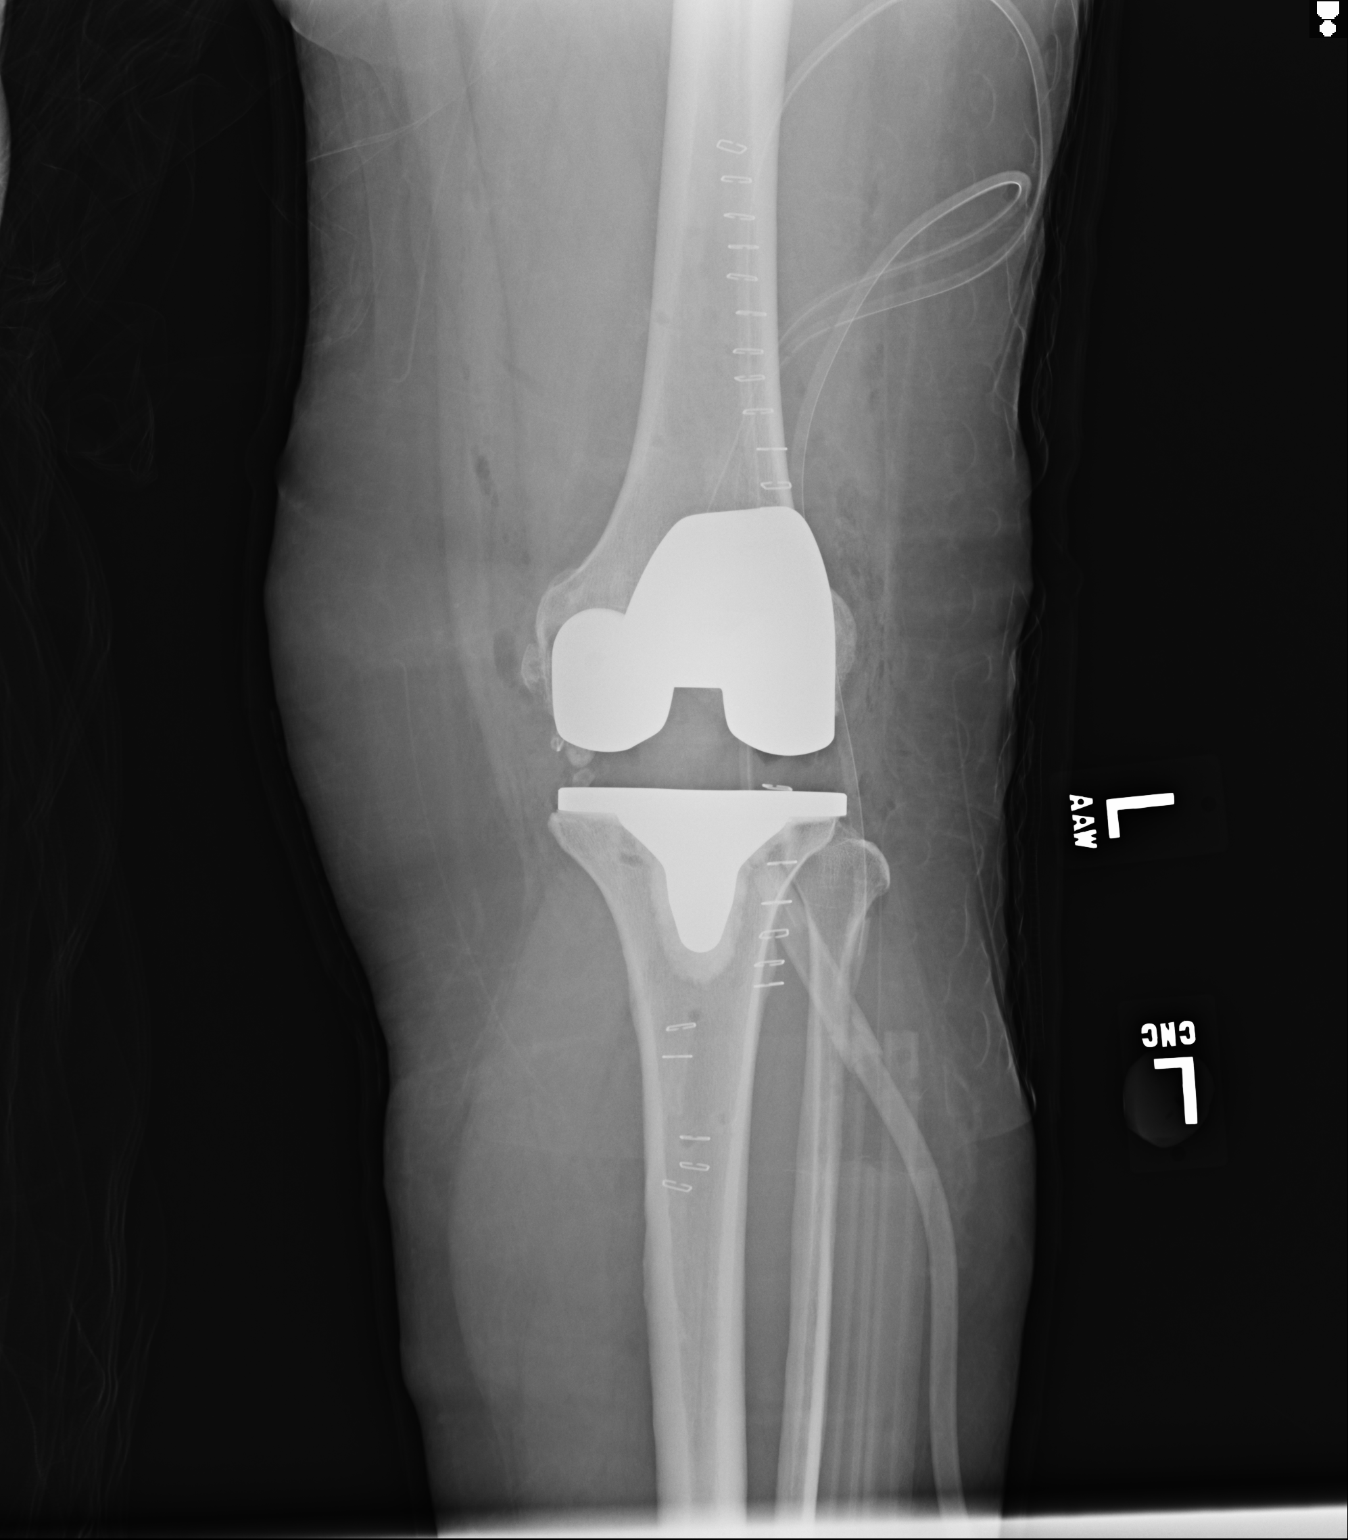
[im 2/3]
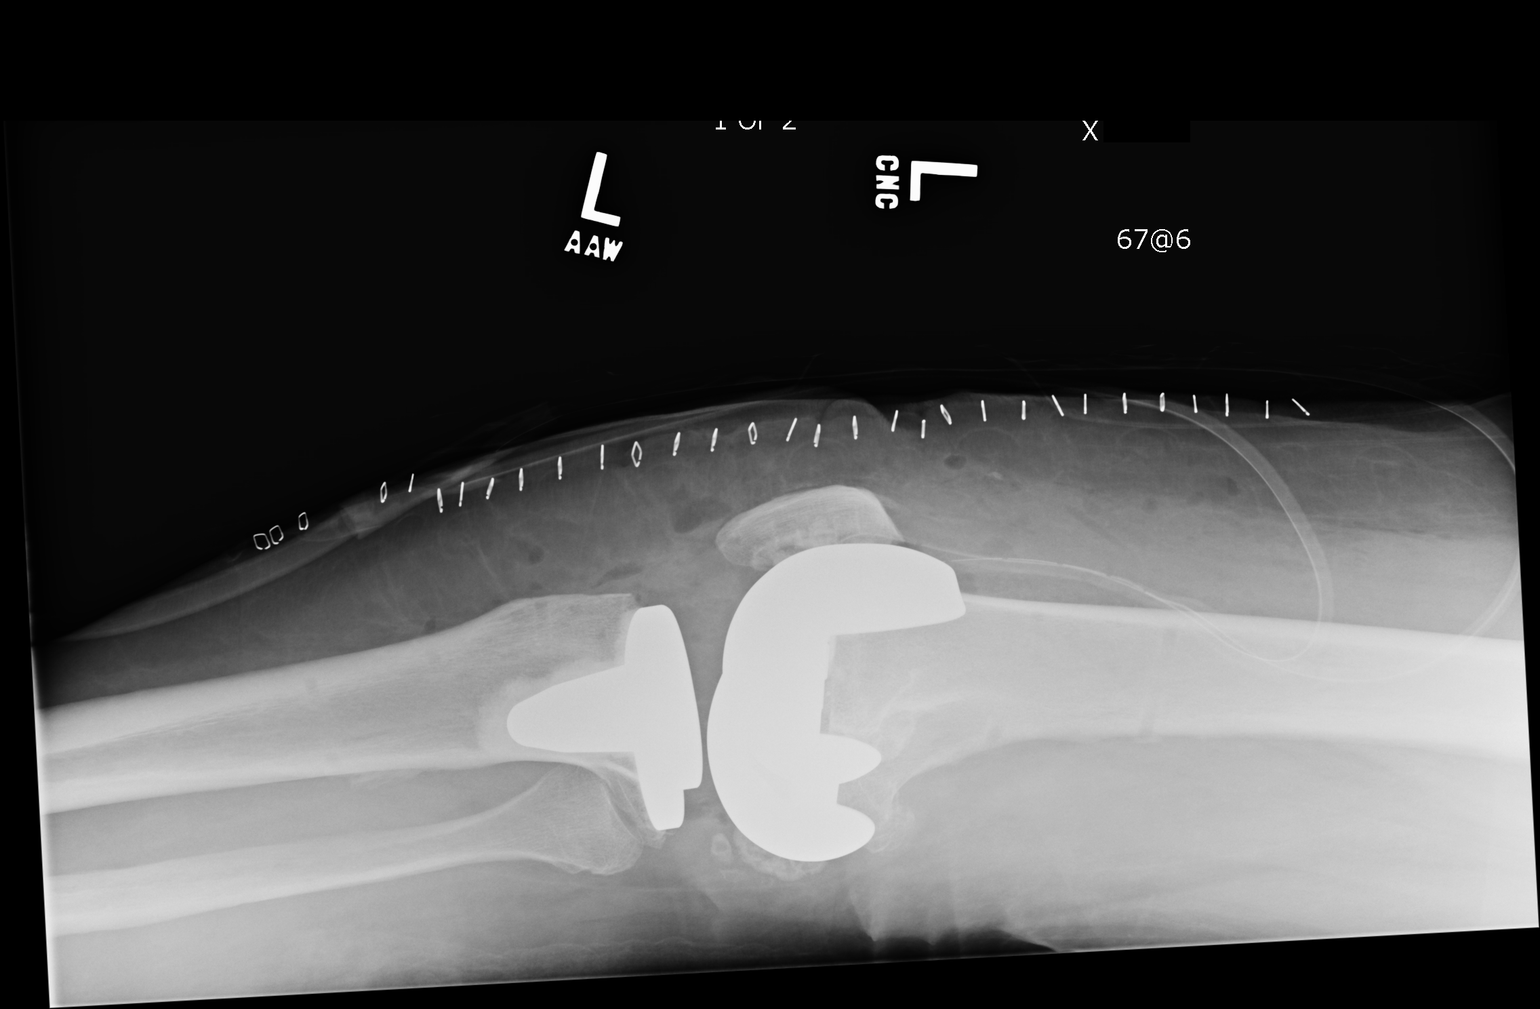
[im 3/3]
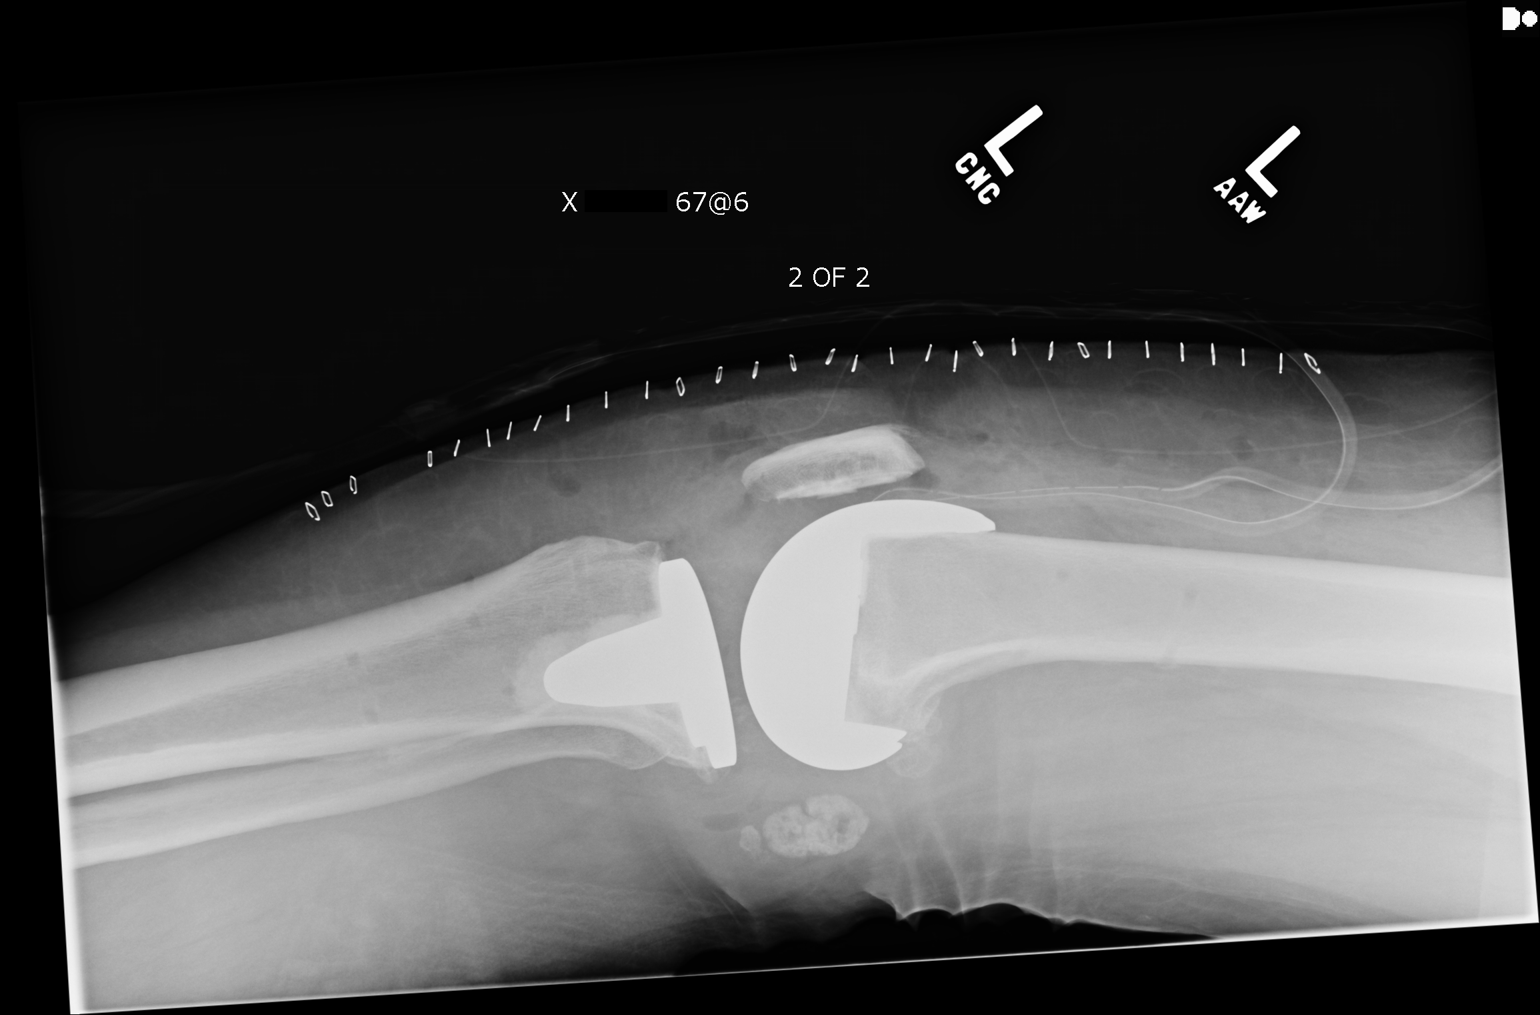

[3 of 3 positions shown; findings below may reference images not displayed]

IMPRESSION: Please see above.

[REDACTED]

## 2015-04-22 DIAGNOSIS — M76891 Other specified enthesopathies of right lower limb, excluding foot: Secondary | ICD-10-CM | POA: Diagnosis not present

## 2015-04-22 DIAGNOSIS — I119 Hypertensive heart disease without heart failure: Secondary | ICD-10-CM | POA: Diagnosis not present

## 2015-04-22 DIAGNOSIS — K5732 Diverticulitis of large intestine without perforation or abscess without bleeding: Secondary | ICD-10-CM | POA: Diagnosis not present

## 2015-04-22 DIAGNOSIS — E784 Other hyperlipidemia: Secondary | ICD-10-CM | POA: Diagnosis not present

## 2015-04-28 DIAGNOSIS — I10 Essential (primary) hypertension: Secondary | ICD-10-CM | POA: Diagnosis not present

## 2015-04-28 DIAGNOSIS — E784 Other hyperlipidemia: Secondary | ICD-10-CM | POA: Diagnosis not present

## 2015-06-05 DIAGNOSIS — E784 Other hyperlipidemia: Secondary | ICD-10-CM | POA: Diagnosis not present

## 2015-06-05 DIAGNOSIS — I119 Hypertensive heart disease without heart failure: Secondary | ICD-10-CM | POA: Diagnosis not present

## 2015-06-05 DIAGNOSIS — K5732 Diverticulitis of large intestine without perforation or abscess without bleeding: Secondary | ICD-10-CM | POA: Diagnosis not present

## 2015-06-29 ENCOUNTER — Emergency Department
Admission: EM | Admit: 2015-06-29 | Discharge: 2015-06-29 | Disposition: A | Discharge status: Home / Self Care | Payer: Commercial Managed Care - HMO | Attending: Emergency Medicine | Admitting: Emergency Medicine

## 2015-06-29 ENCOUNTER — Emergency Department: Payer: Commercial Managed Care - HMO

## 2015-06-29 ENCOUNTER — Encounter: Payer: Self-pay | Admitting: Emergency Medicine

## 2015-06-29 ENCOUNTER — Ambulatory Visit (INDEPENDENT_AMBULATORY_CARE_PROVIDER_SITE_OTHER)
Admission: EM | Admit: 2015-06-29 | Discharge: 2015-06-29 | Disposition: A | Discharge status: Other Acute Inpatient Hospital | Payer: Commercial Managed Care - HMO | Source: Home / Self Care | Attending: Family Medicine | Admitting: Family Medicine

## 2015-06-29 DIAGNOSIS — R251 Tremor, unspecified: Secondary | ICD-10-CM | POA: Insufficient documentation

## 2015-06-29 DIAGNOSIS — Z8719 Personal history of other diseases of the digestive system: Secondary | ICD-10-CM

## 2015-06-29 DIAGNOSIS — T148 Other injury of unspecified body region: Secondary | ICD-10-CM

## 2015-06-29 DIAGNOSIS — I10 Essential (primary) hypertension: Secondary | ICD-10-CM | POA: Diagnosis not present

## 2015-06-29 DIAGNOSIS — F410 Panic disorder [episodic paroxysmal anxiety] without agoraphobia: Secondary | ICD-10-CM

## 2015-06-29 DIAGNOSIS — R109 Unspecified abdominal pain: Secondary | ICD-10-CM | POA: Diagnosis not present

## 2015-06-29 DIAGNOSIS — R0602 Shortness of breath: Secondary | ICD-10-CM | POA: Insufficient documentation

## 2015-06-29 DIAGNOSIS — W57XXXA Bitten or stung by nonvenomous insect and other nonvenomous arthropods, initial encounter: Secondary | ICD-10-CM

## 2015-06-29 DIAGNOSIS — Z87898 Personal history of other specified conditions: Secondary | ICD-10-CM

## 2015-06-29 DIAGNOSIS — G252 Other specified forms of tremor: Secondary | ICD-10-CM

## 2015-06-29 HISTORY — DX: Essential (primary) hypertension: I10

## 2015-06-29 HISTORY — DX: Other chronic pain: G89.29

## 2015-06-29 HISTORY — DX: Anxiety disorder, unspecified: F41.9

## 2015-06-29 HISTORY — DX: Pure hypercholesterolemia, unspecified: E78.00

## 2015-06-29 HISTORY — DX: Cardiomegaly: I51.7

## 2015-06-29 LAB — COMPREHENSIVE METABOLIC PANEL
ALT: 71 U/L — ABNORMAL HIGH (ref 14–54)
ANION GAP: 8 (ref 5–15)
AST: 83 U/L — ABNORMAL HIGH (ref 15–41)
Albumin: 4.1 g/dL (ref 3.5–5.0)
Alkaline Phosphatase: 51 U/L (ref 38–126)
BILIRUBIN TOTAL: 0.5 mg/dL (ref 0.3–1.2)
BUN: 29 mg/dL — ABNORMAL HIGH (ref 6–20)
CALCIUM: 9.1 mg/dL (ref 8.9–10.3)
CO2: 22 mmol/L (ref 22–32)
Chloride: 110 mmol/L (ref 101–111)
Creatinine, Ser: 1.25 mg/dL — ABNORMAL HIGH (ref 0.44–1.00)
GFR calc Af Amer: 51 mL/min — ABNORMAL LOW (ref >60)
GFR, EST NON AFRICAN AMERICAN: 44 mL/min — AB (ref >60)
Glucose, Bld: 100 mg/dL — ABNORMAL HIGH (ref 65–99)
POTASSIUM: 4.2 mmol/L (ref 3.5–5.1)
Sodium: 140 mmol/L (ref 135–145)
TOTAL PROTEIN: 7.1 g/dL (ref 6.5–8.1)

## 2015-06-29 LAB — CBC WITH DIFFERENTIAL/PLATELET
Basophils Absolute: 0.1 10*3/uL (ref 0–0.1)
Basophils Relative: 2 %
EOS ABS: 0.2 10*3/uL (ref 0–0.7)
EOS PCT: 2 %
HCT: 35.7 % (ref 35.0–47.0)
Hemoglobin: 12.2 g/dL (ref 12.0–16.0)
LYMPHS ABS: 2.8 10*3/uL (ref 1.0–3.6)
Lymphocytes Relative: 33 %
MCH: 30.1 pg (ref 26.0–34.0)
MCHC: 34.1 g/dL (ref 32.0–36.0)
MCV: 88.3 fL (ref 80.0–100.0)
MONO ABS: 0.8 10*3/uL (ref 0.2–0.9)
MONOS PCT: 9 %
Neutro Abs: 4.7 10*3/uL (ref 1.4–6.5)
Neutrophils Relative %: 54 %
PLATELETS: 189 10*3/uL (ref 150–440)
RBC: 4.05 MIL/uL (ref 3.80–5.20)
RDW: 13.1 % (ref 11.5–14.5)
WBC: 8.7 10*3/uL (ref 3.6–11.0)

## 2015-06-29 LAB — URINALYSIS COMPLETE WITH MICROSCOPIC (ARMC ONLY)
BILIRUBIN URINE: NEGATIVE
Bacteria, UA: NONE SEEN
GLUCOSE, UA: NEGATIVE mg/dL
HGB URINE DIPSTICK: NEGATIVE
Ketones, ur: NEGATIVE mg/dL
NITRITE: NEGATIVE
Protein, ur: NEGATIVE mg/dL
Specific Gravity, Urine: 1.015 (ref 1.005–1.030)
pH: 5 (ref 5.0–8.0)

## 2015-06-29 LAB — BRAIN NATRIURETIC PEPTIDE: B Natriuretic Peptide: 22 pg/mL (ref 0.0–100.0)

## 2015-06-29 LAB — T4, FREE: FREE T4: 0.69 ng/dL (ref 0.61–1.12)

## 2015-06-29 LAB — TSH: TSH: 1.021 u[IU]/mL (ref 0.350–4.500)

## 2015-06-29 LAB — TROPONIN I

## 2015-06-29 LAB — LIPASE, BLOOD: LIPASE: 53 U/L — AB (ref 11–51)

## 2015-06-29 MED ORDER — ACETAMINOPHEN 325 MG PO TABS
650.0000 mg | ORAL_TABLET | Freq: Once | ORAL | Status: AC
  Administered 2015-06-29: 650 mg via ORAL

## 2015-06-29 MED ORDER — ALPRAZOLAM 0.5 MG PO TABS
0.2500 mg | ORAL_TABLET | ORAL | Status: AC
  Administered 2015-06-29: 0.25 mg via ORAL
  Filled 2015-06-29: qty 1

## 2015-06-29 MED ORDER — ACETAMINOPHEN 325 MG PO TABS
ORAL_TABLET | ORAL | Status: AC
  Administered 2015-06-29: 650 mg via ORAL
  Filled 2015-06-29: qty 2

## 2015-06-29 NOTE — ED Provider Notes (Signed)
CSN: [id number]     Arrival date & time 06/29/15  1301 History   First MD Initiated Contact with Patient 06/29/15 1318     Nurses notes were reviewed.  Chief Complaint  Patient presents with  . Breathing Problem    Pt with SOB x past several days. Very shaky since yesterday and getting worse. Removed two ticks a week or two ago. Cramping stomach pain yesterday with diarrhea.    Patient is here because multiple problems. She reports having shortness of breath last 2 days. She also reports a tremor and shaking since yesterday which is progressively getting worse. She will take about 2 weeks ago doesn't have a rash but she also states that yesterday she started having cramping stomach pain and diarrhea.  She feels tremors all over. She's had a history of anxiety and a history of enlarged heart high cholesterol and chronic pain. She's never smoked. She's had back surgery shoulder surgery knee surgery and does no pertinent family medical history.  He is allergic to codeine and penicillins.   (Consider location/radiation/quality/duration/timing/severity/associated sxs/prior Treatment) Patient is a 65 y.o. female presenting with difficulty breathing and anxiety. The history is provided by the patient. No language interpreter was used.  Breathing Problem This is a new problem. The current episode started 2 days ago. The problem occurs constantly. The problem has been gradually worsening. Associated symptoms include abdominal pain. Pertinent negatives include no chest pain and no headaches. Nothing aggravates the symptoms. Nothing relieves the symptoms. She has tried nothing for the symptoms. The treatment provided no relief.  Anxiety This is a new problem. The current episode started yesterday. The problem occurs constantly. The problem has been gradually worsening. Associated symptoms include abdominal pain. Pertinent negatives include no chest pain and no headaches. Nothing aggravates the symptoms.  Nothing relieves the symptoms. She has tried nothing for the symptoms. The treatment provided no relief.    Past Medical History  Diagnosis Date  . Enlarged heart   . Anxiety   . Hypertension   . High cholesterol   . Chronic pain    Past Surgical History  Procedure Laterality Date  . Back surgery    . Shoulder surgery    . Knee surgery     History reviewed. No pertinent family history. Social History  Substance Use Topics  . Smoking status: Never Smoker   . Smokeless tobacco: None  . Alcohol Use: No   OB History    No data available     Review of Systems  Constitutional: Positive for activity change and fatigue. Negative for appetite change.  Cardiovascular: Negative for chest pain.  Gastrointestinal: Positive for nausea, abdominal pain and diarrhea.  Neurological: Positive for tremors and weakness. Negative for headaches.  Psychiatric/Behavioral: The patient is nervous/anxious.   All other systems reviewed and are negative.   Allergies  Codeine and Penicillins  Home Medications   Prior to Admission medications   Medication Sig Start Date End Date Taking? Authorizing Provider  ALPRAZolam Duanne Moron) 0.25 MG tablet Take 0.25 mg by mouth at bedtime as needed for anxiety.   Yes Historical Provider, MD  budesonide-formoterol (SYMBICORT) 80-4.5 MCG/ACT inhaler Inhale 2 puffs into the lungs 2 (two) times daily.   Yes Historical Provider, MD  fenofibrate (TRICOR) 145 MG tablet Take 145 mg by mouth daily.   Yes Historical Provider, MD  lisinopril-hydrochlorothiazide (PRINZIDE,ZESTORETIC) 20-12.5 MG tablet Take 1 tablet by mouth daily.   Yes Historical Provider, MD  omeprazole (PRILOSEC) 40 MG capsule Take  40 mg by mouth daily.   Yes Historical Provider, MD  venlafaxine (EFFEXOR) 75 MG tablet Take 75 mg by mouth 2 (two) times daily.   Yes Historical Provider, MD   Meds Ordered and Administered this Visit  Medications - No data to display  BP 115/55 mmHg  Pulse 111   Temp(Src) 98 F (36.7 C) (Oral)  Resp 20  Ht 5\' 5"  (1.651 m)  Wt 220 lb (99.791 kg)  BMI 36.61 kg/m2  SpO2 97% No data found.   Physical Exam  Constitutional: She is oriented to person, place, and time. She appears well-developed and well-nourished. She appears ill. She appears distressed.  Blood pressure is elevated 160/103 and pulse ox is 97% we'll place on 2 L of oxygen  HENT:  Head: Normocephalic and atraumatic.  Eyes: Pupils are equal, round, and reactive to light.  Neck: Neck supple.  Cardiovascular: Normal rate and regular rhythm.   Pulmonary/Chest: She is in respiratory distress. She has no wheezes.  Abdominal: Soft.  Musculoskeletal: Normal range of motion.  Neurological: She is alert and oriented to person, place, and time.  Skin: Skin is warm.  Psychiatric: She has a normal mood and affect.  Vitals reviewed.   ED Course  Procedures (including critical care time)  Labs Review Labs Reviewed - No data to display  Imaging Review No results found.   Visual Acuity Review  Right Eye Distance:   Left Eye Distance:   Bilateral Distance:    Right Eye Near:   Left Eye Near:    Bilateral Near:         MDM   1. Shortness of breath   2. Anxiety attack   3. History of diarrhea   4. Tick bite    Discussed with charge nurse Marya Amsler at [facility] ED. Will transfer patient to the [facility] ED system because no benzo days exams are available here on the site. We'll hold off on DuoNeb treatments as that might make the tremor worse. O2 will be started. Should be noted the patient denies any chest pain. Gave patient option of having family transport to [facility] ED but she declines and request EMS to do.    Note: This dictation was prepared with Dragon dictation along with smaller phrase technology. Any transcriptional errors that result from this process are unintentional.     Frederich Cha, MD 06/29/15 [phone]

## 2015-06-29 NOTE — Discharge Instructions (Signed)
As we discussed, your workup today was reassuring.  Though we do not know exactly what is causing your symptoms, it appears that you have no emergent medical condition at this time are safe to go home and follow up as recommended in this paperwork.  Dr. Lavera Guise may be able to refer you to a neurologist if you continue to have tremors.  Please return immediately to the Emergency Department if you develop any new or worsening symptoms that concern you.   Tremor A tremor is trembling or shaking that you cannot control. Most tremors affect the hands or arms. Tremors can also affect the head, vocal cords, face, and other parts of the body. There are many types of tremors. Common types include:   Essential tremor. These usually occur in people over the age of 31. It may run in families and can happen in otherwise healthy people.   Resting tremor. These occur when the muscles are at rest, such as when your hands are resting in your lap. People with Parkinson disease often have resting tremors.   Postural tremor. These occur when you try to hold a pose, such as keeping your hands outstretched.   Kinetic tremor. These occur during purposeful movement, such as trying to touch a finger to your nose.   Task-specific tremor. These may occur when you perform tasks such as handwriting, speaking, or standing.   Psychogenic tremor. These dramatically lessen or disappear when you are distracted. They can happen in people of all ages.  Some types of tremors have no known cause. Tremors can also be a symptom of nervous system problems (neurological disorders) that may occur with aging. Some tremors go away with treatment while others do not.  HOME CARE INSTRUCTIONS Watch your tremor for any changes. The following actions may help to lessen any discomfort you are feeling:  Take medicines only as directed by your health care provider.   Limit alcohol intake to no more than 1 drink per day for nonpregnant  women and 2 drinks per day for men. One drink equals 12 oz of beer, 5 oz of wine, or 1 oz of hard liquor.  Do not use any tobacco products, including cigarettes, chewing tobacco, or electronic cigarettes. If you need help quitting, ask your health care provider.   Avoid extreme heat or cold.   Limit the amount of caffeine you consumeas directed by your health care provider.   Try to get 8 hours of sleep each night.  Find ways to manage your stress, such as meditation or yoga.  Keep all follow-up visits as directed by your health care provider. This is important. SEEK MEDICAL CARE IF:  You start having a tremor after starting a new medicine.  You have tremor with other symptoms such as:  Numbness.  Tingling.  Pain.  Weakness.  Your tremor gets worse.  Your tremor interferes with your day-to-day life.   This information is not intended to replace advice given to you by your health care provider. Make sure you discuss any questions you have with your health care provider.   Document Released: 01/01/2002 Document Revised: 02/01/2014 Document Reviewed: 07/09/2013 Elsevier Interactive Patient Education [employer].

## 2015-06-29 NOTE — ED Notes (Signed)
Pt c/o tremors x 2 days, on arrival pt visibly shaking. Per ems it comes and goes. Pt went to [place] first and was sent over for eval here. Pt has no neurological deficits. Pt also states for at least 4 days she has noticed that she feels sob when she takes her dog for a walk. vss now. Pt did not take her morning meds - okay'd with dr for her to take her lisinipril/hctz, effexor and nexium.

## 2015-06-29 NOTE — ED Provider Notes (Signed)
[facility] Emergency Department Provider Note  ____________________________________________  Time seen: Approximately 4:57 PM  I have reviewed the triage vital signs and the nursing notes.   HISTORY  Chief Complaint Tremors and Shortness of Breath    HPI Sandra Dudley is a 65 y.o. female with a reported past medical history of anxiety, hypertension, hypercholesterolemia, and chronic pain who presents with a variety of complaints including shortness of breath for about 2 days and tremors ("shaking") since yesterday; the tremors, however, are the main reason for her visit.  She reports that she has been in her normal state of health until about 3 days ago when she had multiple episodes of diarrhea.  That resolved, but she felt weak the next day and had a bit of shortness of breath.  The SOB resolved today, but she developed a tremor in bilateral upper extremities today.  Occurs at rest and with intentional movements.  Denies HA, focal numbness/weakness.  Also denies fever/chills, CP, abd pain, N/V.  Tremor is moderate in severity, and it gets better and worse intermittently, without anything in particular causing, exacerbating, nor improving it.  Patient takes Xanax chronically but reports she only takes it as needed and has not had it for 1-2 days.  Also takes Tramadol chronically, but reports that her doctor told her she was only getting one more refill, so she claims she "flushed" the rest of her bottle and has not had any for several days.     Past Medical History  Diagnosis Date  . Enlarged heart   . Anxiety   . Hypertension   . High cholesterol   . Chronic pain     There are no active problems to display for this patient.   Past Surgical History  Procedure Laterality Date  . Back surgery    . Shoulder surgery    . Knee surgery      Current Outpatient Rx  Name  Route  Sig  Dispense  Refill  . ALPRAZolam (XANAX) 0.25 MG tablet   Oral  Take 0.25 mg by mouth at bedtime as needed for anxiety.         . budesonide-formoterol (SYMBICORT) 80-4.5 MCG/ACT inhaler   Inhalation   Inhale 2 puffs into the lungs 2 (two) times daily.         . fenofibrate (TRICOR) 145 MG tablet   Oral   Take 145 mg by mouth daily.         Marland Kitchen lisinopril-hydrochlorothiazide (PRINZIDE,ZESTORETIC) 20-12.5 MG tablet   Oral   Take 1 tablet by mouth daily.         Marland Kitchen omeprazole (PRILOSEC) 40 MG capsule   Oral   Take 40 mg by mouth daily.         Marland Kitchen venlafaxine (EFFEXOR) 75 MG tablet   Oral   Take 75 mg by mouth 2 (two) times daily.           Allergies Codeine and Penicillins  History reviewed. No pertinent family history.  Social History Social History  Substance Use Topics  . Smoking status: Never Smoker   . Smokeless tobacco: None  . Alcohol Use: No    Review of Systems Constitutional: No fever/chills Eyes: No visual changes. ENT: No sore throat. Cardiovascular: Denies chest pain. Respiratory: shortness of breath yesterday, resolved today Gastrointestinal: No abdominal pain.  No nausea, no vomiting.  Diarrhea about 2 days ago, no resolved.  No constipation. Genitourinary: Negative for dysuria. Musculoskeletal: Negative for back  pain. Skin: Negative for rash. Neurological: Tremor in both arms.  Negative for headaches, focal weakness or numbness.  10-point ROS otherwise negative.  ____________________________________________   PHYSICAL EXAM:  VITAL SIGNS: ED Triage Vitals  Enc Vitals Group     BP 06/29/15 1519 132/74 mmHg     Pulse Rate 06/29/15 1519 117     Resp 06/29/15 1519 18     Temp 06/29/15 1519 97.9 F (36.6 C)     Temp Source 06/29/15 1519 Oral     SpO2 06/29/15 1519 95 %     Weight 06/29/15 1519 220 lb (99.791 kg)     Height 06/29/15 1519 5\' 5"  (1.651 m)     Head Cir --      Peak Flow --      Pain Score --      Pain Loc --      Pain Edu? --      Excl. in [place]? --     Constitutional: Alert and  oriented. Well appearing and in no acute distress. Eyes: Conjunctivae are normal. PERRL. EOMI.  No nystagmus. Head: Atraumatic. Nose: No congestion/rhinnorhea. Mouth/Throat: Mucous membranes are moist.  Oropharynx non-erythematous. Neck: No stridor.  No meningeal signs.   Cardiovascular: Normal rate, regular rhythm. Good peripheral circulation. Grossly normal heart sounds.   Respiratory: Normal respiratory effort.  No retractions. Lungs CTAB. Gastrointestinal: Soft and nontender. No distention.  Musculoskeletal: No lower extremity tenderness nor edema. No gross deformities of extremities. Neurologic:  Resting tremor in bilateral arms.  Does not improve when reaching for objects.  Normal speech and language. No gross focal neurologic deficits are appreciated. Good grip strength and normal muscle strength in major muscle groups in arms and legs.  No dysmetria.  Cranial nerves grossly intact.  No cogwheel rigidity. Skin:  Skin is warm, dry and intact. No rash noted. Psychiatric: Mood and affect are normal. Speech and behavior are normal.  ____________________________________________   LABS (all labs ordered are listed, but only abnormal results are displayed)  Labs Reviewed  COMPREHENSIVE METABOLIC PANEL - Abnormal; Notable for the following:    Glucose, Bld 100 (*)    BUN 29 (*)    Creatinine, Ser 1.25 (*)    AST 83 (*)    ALT 71 (*)    GFR calc non Af Amer 44 (*)    GFR calc Af Amer 51 (*)    All other components within normal limits  LIPASE, BLOOD - Abnormal; Notable for the following:    Lipase 53 (*)    All other components within normal limits  URINALYSIS COMPLETEWITH MICROSCOPIC ([facility] ONLY) - Abnormal; Notable for the following:    Color, Urine YELLOW (*)    APPearance CLEAR (*)    Leukocytes, UA TRACE (*)    Squamous Epithelial / LPF 0-5 (*)    All other components within normal limits  CBC WITH DIFFERENTIAL/PLATELET  TROPONIN I  BRAIN NATRIURETIC PEPTIDE  TSH  T4,  FREE  T3, FREE   ____________________________________________  EKG  ED ECG REPORT I, Wissam Resor, the attending physician, personally viewed and interpreted this ECG.  Date: 06/29/2015 EKG Time: 17:30 Rate: 90 Rhythm: normal sinus rhythm QRS Axis: normal Intervals: normal ST/T Wave abnormalities: normal Conduction Disturbances: none Narrative Interpretation: unremarkable.  Computer interprets as a-fib due to artifact, but this is normal sinus.  ____________________________________________  RADIOLOGY   Dg Chest 2 View  06/29/2015  CLINICAL DATA:  Acute shortness of breath. EXAM: CHEST  2 VIEW COMPARISON:  12/06/2013 and prior radiographs FINDINGS: The cardiomediastinal silhouette is unremarkable. Elevation of the right hemidiaphragm again noted. There is no evidence of focal airspace disease, pulmonary edema, suspicious pulmonary nodule/mass, pleural effusion, or pneumothorax. No acute bony abnormalities are identified. IMPRESSION: No active cardiopulmonary disease. Electronically Signed   By: Margarette Canada M.D.   On: 06/29/2015 16:34   Ct Head Wo Contrast  06/29/2015  CLINICAL DATA:  New onset tremors EXAM: CT HEAD WITHOUT CONTRAST TECHNIQUE: Contiguous axial images were obtained from the base of the skull through the vertex without intravenous contrast. COMPARISON:  Jun 24, 2007 FINDINGS: Paranasal sinuses, mastoid air cells, and bones are normal. Extracranial soft tissues are unremarkable. Significant streak artifact limits evaluation. Within this limitation, no subdural, epidural, or subarachnoid hemorrhage identified. No mass, mass effect, or midline shift. Ventricles and sulci are normal and unchanged. The cerebellum, brainstem, and basal cisterns are normal. No acute cortical ischemia or infarct. IMPRESSION: No abnormalities identified. The study is mildly limited due to streak artifact. Electronically Signed   By: Dorise Bullion III M.D   On: 06/29/2015 18:54     ____________________________________________   PROCEDURES  Procedure(s) performed: None  Critical Care performed: No ____________________________________________   INITIAL IMPRESSION / ASSESSMENT AND PLAN / ED COURSE  Pertinent labs & imaging results that were available during my care of the patient were reviewed by me and considered in my medical decision making (see chart for details).   Evaluated the patient broadly and nothing significantly abnormal was discovered.  I gave her a dose of her Xanax (0.25 mg) and that did seem to improve the tremor.  I encouraged her to follow up with her PCP tomorrow and look into either additional outpatient testing or referral to neuro.  Unable to identify any acute, emergent medical conditions.  Patient understands and agrees and states she is ready to go home and get something to eat. ____________________________________________  FINAL CLINICAL IMPRESSION(S) / ED DIAGNOSES  Final diagnoses:  Coarse tremors     MEDICATIONS GIVEN DURING THIS VISIT:  Medications  ALPRAZolam (XANAX) tablet 0.25 mg (0.25 mg Oral Given 06/29/15 1756)  acetaminophen (TYLENOL) tablet 650 mg (650 mg Oral Given 06/29/15 1941)     NEW OUTPATIENT MEDICATIONS STARTED DURING THIS VISIT:  Discharge Medication List as of 06/29/2015  8:29 PM        Note:  This document was prepared using Dragon voice recognition software and may include unintentional dictation errors.   Hinda Kehr, MD 06/29/15 2248

## 2015-06-29 NOTE — Discharge Instructions (Signed)
Panic Attacks Panic attacks are sudden, short feelings of great fear or discomfort. You may have them for no reason when you are relaxed, when you are uneasy (anxious), or when you are sleeping.  HOME CARE  Take all your medicines as told.  Check with your doctor before starting new medicines.  Keep all doctor visits. GET HELP IF: 1. You are not able to take your medicines as told. 2. Your symptoms do not get better. 3. Your symptoms get worse. GET HELP RIGHT AWAY IF:  Your attacks seem different than your normal attacks.  You have thoughts about hurting yourself or others.  You take panic attack medicine and you have a side effect. MAKE SURE YOU:  Understand these instructions.  Will watch your condition.  Will get help right away if you are not doing well or get worse.   This information is not intended to replace advice given to you by your health care provider. Make sure you discuss any questions you have with your health care provider.   Document Released: 02/13/2010 Document Revised: 11/01/2012 Document Reviewed: 08/25/2012 Elsevier Interactive Patient Education 2016 Elsevier Inc[employer]  Shortness of Breath Shortness of breath means you have trouble breathing. Shortness of breath needs medical care right away. HOME CARE   Do not smoke.  Avoid being around chemicals or things (paint fumes, dust) that may bother your breathing.  Rest as needed. Slowly begin your normal activities.  Only take medicines as told by your doctor.  Keep all doctor visits as told. GET HELP RIGHT AWAY IF:  4. Your shortness of breath gets worse. 5. You feel lightheaded, pass out (faint), or have a cough that is not helped by medicine. 6. You cough up blood. 7. You have pain with breathing. 8. You have pain in your chest, arms, shoulders, or belly (abdomen). 9. You have a fever. 10. You cannot walk up stairs or exercise the way you normally do. 11. You do not get better in the time  expected. 12. You have a hard time doing normal activities even with rest. 13. You have problems with your medicines. 14. You have any new symptoms. MAKE SURE YOU:  Understand these instructions.  Will watch your condition.  Will get help right away if you are not doing well or get worse.   This information is not intended to replace advice given to you by your health care provider. Make sure you discuss any questions you have with your health care provider.   Document Released: 06/30/2007 Document Revised: 01/16/2013 Document Reviewed: 03/29/2011 Elsevier Interactive Patient Education 2016 [place] Information Ticks are insects that attach themselves to the skin. There are many types of ticks. Common types include wood ticks and deer ticks. Sometimes, ticks carry diseases that can make a person very ill. The most common places for ticks to attach themselves are the scalp, neck, armpits, waist, and groin.  HOW CAN YOU PREVENT TICK BITES? Take these steps to help prevent tick bites when you are outdoors:  Wear long sleeves and long pants.  Wear white clothes so you can see ticks more easily.  Tuck your pant legs into your socks.  If walking on a trail, stay in the middle of the trail to avoid brushing against bushes.  Avoid walking through areas with long grass.  Put bug spray on all skin that is showing and along boot tops, pant legs, and sleeve cuffs.  Check clothes, hair, and skin often and before going  inside.  Brush off any ticks that are not attached.  Take a shower or bath as soon as possible after being outdoors. HOW SHOULD YOU REMOVE A TICK? Ticks should be removed as soon as possible to help prevent diseases. 15. If latex gloves are available, put them on before trying to remove a tick. 16. Use tweezers to grasp the tick as close to the skin as possible. You may also use curved forceps or a tick removal tool. Grasp the tick as close to its head as  possible. Avoid grasping the tick on its body. 17. Pull gently upward until the tick lets go. Do not twist the tick or jerk it suddenly. This may break off the tick's head or mouth parts. 18. Do not squeeze or crush the tick's body. This could force disease-carrying fluids from the tick into your body. 19. After the tick is removed, wash the bite area and your hands with soap and water or alcohol. 20. Apply a small amount of antiseptic cream or ointment to the bite site. 21. Wash any tools that were used. Do not try to remove a tick by applying a hot match, petroleum jelly, or fingernail polish to the tick. These methods do not work. They may also increase the chances of disease being spread from the tick bite. WHEN SHOULD YOU SEEK HELP? Contact your health care provider if you are unable to remove a tick or if a part of the tick breaks off in the skin. After a tick bite, you need to watch for signs and symptoms of diseases that can be spread by ticks. Contact your health care provider if you develop any of the following:  Fever.  Rash.  Redness and puffiness (swelling) in the area of the tick bite.  Tender, puffy lymph glands.  Watery poop (diarrhea).  Weight loss.  Cough.  Feeling more tired than normal (fatigue).  Muscle, joint, or bone pain.  Belly (abdominal) pain.  Headache.  Change in your level of consciousness.  Trouble walking or moving your legs.  Loss of feeling (numbness) in the legs.  Loss of movement (paralysis).  Shortness of breath.  Confusion.  Throwing up (vomiting) many times.   This information is not intended to replace advice given to you by your health care provider. Make sure you discuss any questions you have with your health care provider.   Document Released: 04/07/2009 Document Revised: 09/13/2012 Document Reviewed: 06/21/2012 Elsevier Interactive Patient Education [employer].

## 2015-06-29 NOTE — ED Notes (Addendum)
Noticed hands tremoring yesterday. On and off since then. Pulled two ticks off herself 2 weeks ago.

## 2015-06-29 NOTE — ED Notes (Signed)
Patient transported to X-ray 

## 2015-07-01 ENCOUNTER — Ambulatory Visit: Payer: Self-pay | Admitting: Podiatry

## 2015-07-01 LAB — T3, FREE: T3 FREE: 2.5 pg/mL (ref 2.0–4.4)

## 2015-07-09 DIAGNOSIS — M25571 Pain in right ankle and joints of right foot: Secondary | ICD-10-CM | POA: Diagnosis not present

## 2015-07-09 DIAGNOSIS — M19071 Primary osteoarthritis, right ankle and foot: Secondary | ICD-10-CM | POA: Diagnosis not present

## 2015-07-09 DIAGNOSIS — M79672 Pain in left foot: Secondary | ICD-10-CM | POA: Diagnosis not present

## 2015-07-09 DIAGNOSIS — M7671 Peroneal tendinitis, right leg: Secondary | ICD-10-CM | POA: Diagnosis not present

## 2015-07-09 DIAGNOSIS — M79671 Pain in right foot: Secondary | ICD-10-CM | POA: Diagnosis not present

## 2015-07-11 DIAGNOSIS — R251 Tremor, unspecified: Secondary | ICD-10-CM | POA: Diagnosis not present

## 2015-07-11 DIAGNOSIS — G250 Essential tremor: Secondary | ICD-10-CM | POA: Diagnosis not present

## 2015-07-24 ENCOUNTER — Ambulatory Visit
Admission: EM | Admit: 2015-07-24 | Discharge: 2015-07-24 | Disposition: A | Discharge status: Home / Self Care | Payer: Commercial Managed Care - HMO | Attending: Family Medicine | Admitting: Family Medicine

## 2015-07-24 ENCOUNTER — Encounter: Payer: Self-pay | Admitting: *Deleted

## 2015-07-24 DIAGNOSIS — J069 Acute upper respiratory infection, unspecified: Secondary | ICD-10-CM

## 2015-07-24 MED ORDER — BENZONATATE 200 MG PO CAPS: 200.0000 mg | ORAL_CAPSULE | Freq: Three times a day (TID) | ORAL | Status: DC

## 2015-07-24 MED ORDER — FLUTICASONE PROPIONATE 50 MCG/ACT NA SUSP: 2.0000 | Freq: Every day | NASAL | Status: DC

## 2015-07-24 NOTE — Discharge Instructions (Signed)
Cool Mist Vaporizers °Vaporizers may help relieve the symptoms of a cough and cold. They add moisture to the air, which helps mucus to become thinner and less sticky. This makes it easier to breathe and cough up secretions. Cool mist vaporizers do not cause serious burns like hot mist vaporizers, which may also be called steamers or humidifiers. Vaporizers have not been proven to help with colds. You should not use a vaporizer if you are allergic to mold. °HOME CARE INSTRUCTIONS °· Follow the package instructions for the vaporizer. °· Do not use anything other than distilled water in the vaporizer. °· Do not run the vaporizer all of the time. This can cause mold or bacteria to grow in the vaporizer. °· Clean the vaporizer after each time it is used. °· Clean and dry the vaporizer well before storing it. °· Stop using the vaporizer if worsening respiratory symptoms develop. °  °This information is not intended to replace advice given to you by your health care provider. Make sure you discuss any questions you have with your health care provider. °  °Document Released: 10/09/2003 Document Revised: 01/16/2013 Document Reviewed: 05/31/2012 °Elsevier Interactive Patient Education ©2016 Elsevier Inc. ° °Upper Respiratory Infection, Adult °Most upper respiratory infections (URIs) are a viral infection of the air passages leading to the lungs. A URI affects the nose, throat, and upper air passages. The most common type of URI is nasopharyngitis and is typically referred to as "the common cold." °URIs run their course and usually go away on their own. Most of the time, a URI does not require medical attention, but sometimes a bacterial infection in the upper airways can follow a viral infection. This is called a secondary infection. Sinus and middle ear infections are common types of secondary upper respiratory infections. °Bacterial pneumonia can also complicate a URI. A URI can worsen asthma and chronic obstructive  pulmonary disease (COPD). Sometimes, these complications can require emergency medical care and may be life threatening.  °CAUSES °Almost all URIs are caused by viruses. A virus is a type of germ and can spread from one person to another.  °RISKS FACTORS °You may be at risk for a URI if:  °· You smoke.   °· You have chronic heart or lung disease. °· You have a weakened defense (immune) system.   °· You are very young or very old.   °· You have nasal allergies or asthma. °· You work in crowded or poorly ventilated areas. °· You work in health care facilities or schools. °SIGNS AND SYMPTOMS  °Symptoms typically develop 2-3 days after you come in contact with a cold virus. Most viral URIs last 7-10 days. However, viral URIs from the influenza virus (flu virus) can last 14-18 days and are typically more severe. Symptoms may include:  °· Runny or stuffy (congested) nose.   °· Sneezing.   °· Cough.   °· Sore throat.   °· Headache.   °· Fatigue.   °· Fever.   °· Loss of appetite.   °· Pain in your forehead, behind your eyes, and over your cheekbones (sinus pain). °· Muscle aches.   °DIAGNOSIS  °Your health care provider may diagnose a URI by: °· Physical exam. °· Tests to check that your symptoms are not due to another condition such as: °¨ Strep throat. °¨ Sinusitis. °¨ Pneumonia. °¨ Asthma. °TREATMENT  °A URI goes away on its own with time. It cannot be cured with medicines, but medicines may be prescribed or recommended to relieve symptoms. Medicines may help: °· Reduce your fever. °· Reduce   your cough. °· Relieve nasal congestion. °HOME CARE INSTRUCTIONS  °· Take medicines only as directed by your health care provider.   °· Gargle warm saltwater or take cough drops to comfort your throat as directed by your health care provider. °· Use a warm mist humidifier or inhale steam from a shower to increase air moisture. This may make it easier to breathe. °· Drink enough fluid to keep your urine clear or pale yellow.   °· Eat  soups and other clear broths and maintain good nutrition.   °· Rest as needed.   °· Return to work when your temperature has returned to normal or as your health care provider advises. You may need to stay home longer to avoid infecting others. You can also use a face mask and careful hand washing to prevent spread of the virus. °· Increase the usage of your inhaler if you have asthma.   °· Do not use any tobacco products, including cigarettes, chewing tobacco, or electronic cigarettes. If you need help quitting, ask your health care provider. °PREVENTION  °The best way to protect yourself from getting a cold is to practice good hygiene.  °· Avoid oral or hand contact with people with cold symptoms.   °· Wash your hands often if contact occurs.   °There is no clear evidence that vitamin C, vitamin E, echinacea, or exercise reduces the chance of developing a cold. However, it is always recommended to get plenty of rest, exercise, and practice good nutrition.  °SEEK MEDICAL CARE IF:  °· You are getting worse rather than better.   °· Your symptoms are not controlled by medicine.   °· You have chills. °· You have worsening shortness of breath. °· You have brown or red mucus. °· You have yellow or brown nasal discharge. °· You have pain in your face, especially when you bend forward. °· You have a fever. °· You have swollen neck glands. °· You have pain while swallowing. °· You have white areas in the back of your throat. °SEEK IMMEDIATE MEDICAL CARE IF:  °· You have severe or persistent: °¨ Headache. °¨ Ear pain. °¨ Sinus pain. °¨ Chest pain. °· You have chronic lung disease and any of the following: °¨ Wheezing. °¨ Prolonged cough. °¨ Coughing up blood. °¨ A change in your usual mucus. °· You have a stiff neck. °· You have changes in your: °¨ Vision. °¨ Hearing. °¨ Thinking. °¨ Mood. °MAKE SURE YOU:  °· Understand these instructions. °· Will watch your condition. °· Will get help right away if you are not doing well or  get worse. °  °This information is not intended to replace advice given to you by your health care provider. Make sure you discuss any questions you have with your health care provider. °  °Document Released: 07/07/2000 Document Revised: 05/28/2014 Document Reviewed: 04/18/2013 °Elsevier Interactive Patient Education ©2016 Elsevier Inc. ° °

## 2015-07-24 NOTE — ED Provider Notes (Signed)
CSN: [id number]     Arrival date & time 07/24/15  1517 History   First MD Initiated Contact with Patient 07/24/15 1607     Chief Complaint  Patient presents with  . Cough  . Nasal Congestion   (Consider location/radiation/quality/duration/timing/severity/associated sxs/prior Treatment) HPI  [name] who presents with symptoms of cough with thick yellow sputum production nasal congestion and a sore throat that began 3 days ago. The sore throat has since subsided. Now she has voice change. He said no fever or chills. Her son and his wife have both had the illness with the female having pneumonia treated with antibiotics. She had diarrhea for the initial 2 days but that has since subsided.       Past Medical History  Diagnosis Date  . Enlarged heart   . Anxiety   . Hypertension   . High cholesterol   . Chronic pain    Past Surgical History  Procedure Laterality Date  . Back surgery    . Shoulder surgery    . Knee surgery     Family History  Problem Relation Age of Onset  . Hypertension Mother   . Cancer Father    Social History  Substance Use Topics  . Smoking status: Never Smoker   . Smokeless tobacco: Never Used  . Alcohol Use: No   OB History    No data available     Review of Systems  Constitutional: Positive for activity change. Negative for fever, chills and fatigue.  Respiratory: Positive for cough. Negative for shortness of breath, wheezing and stridor.   All other systems reviewed and are negative.   Allergies  Codeine and Penicillins  Home Medications   Prior to Admission medications   Medication Sig Start Date End Date Taking? Authorizing Provider  albuterol (PROVENTIL HFA;VENTOLIN HFA) 108 (90 Base) MCG/ACT inhaler Inhale 1 puff into the lungs every 6 (six) hours as needed for wheezing or shortness of breath.   Yes Historical Provider, MD  ALPRAZolam Duanne Moron) 0.25 MG tablet Take 0.25 mg by mouth at bedtime as needed for anxiety.    Yes Historical Provider, MD  budesonide-formoterol (SYMBICORT) 80-4.5 MCG/ACT inhaler Inhale 2 puffs into the lungs 2 (two) times daily.   Yes Historical Provider, MD  fenofibrate (TRICOR) 145 MG tablet Take 145 mg by mouth daily.   Yes Historical Provider, MD  lisinopril-hydrochlorothiazide (PRINZIDE,ZESTORETIC) 20-12.5 MG tablet Take 1 tablet by mouth daily.   Yes Historical Provider, MD  omeprazole (PRILOSEC) 40 MG capsule Take 40 mg by mouth daily.   Yes Historical Provider, MD  primidone (MYSOLINE) 50 MG tablet Take 50 mg by mouth 2 (two) times daily.   Yes Historical Provider, MD  venlafaxine (EFFEXOR) 75 MG tablet Take 75 mg by mouth 2 (two) times daily.   Yes Historical Provider, MD  benzonatate (TESSALON) 200 MG capsule Take 1 capsule (200 mg total) by mouth 3 (three) times daily. 07/24/15   Lorin Picket, PA-C  fluticasone (FLONASE) 50 MCG/ACT nasal spray Place 2 sprays into both nostrils daily. 07/24/15   Lorin Picket, PA-C   Meds Ordered and Administered this Visit  Medications - No data to display  BP 130/48 mmHg  Pulse 75  Temp(Src) 97.7 F (36.5 C) (Oral)  Resp 18  Ht 5\' 5"  (1.651 m)  Wt 220 lb (99.791 kg)  BMI 36.61 kg/m2  SpO2 97% No data found.   Physical Exam  Constitutional: She is oriented to person, place, and time. She  appears well-developed and well-nourished. No distress.  HENT:  Head: Normocephalic and atraumatic.  Right Ear: External ear normal.  Left Ear: External ear normal.  Nose: Nose normal.  Mouth/Throat: Oropharynx is clear and moist. No oropharyngeal exudate.  Eyes: Conjunctivae are normal.  Neck: Normal range of motion. Neck supple.  Pulmonary/Chest: Effort normal and breath sounds normal. No respiratory distress. She has no wheezes. She has no rales.  Musculoskeletal: Normal range of motion. She exhibits no edema or tenderness.  Lymphadenopathy:    She has no cervical adenopathy.  Neurological: She is alert and oriented to person,  place, and time.  Skin: Skin is warm and dry. She is not diaphoretic.  Psychiatric: She has a normal mood and affect. Her behavior is normal. Judgment and thought content normal.  Nursing note and vitals reviewed.   ED Course  Procedures (including critical care time)  Labs Review Labs Reviewed - No data to display  Imaging Review No results found.   Visual Acuity Review  Right Eye Distance:   Left Eye Distance:   Bilateral Distance:    Right Eye Near:   Left Eye Near:    Bilateral Near:         MDM   1. Acute URI    New Prescriptions   BENZONATATE (TESSALON) 200 MG CAPSULE    Take 1 capsule (200 mg total) by mouth 3 (three) times daily.   FLUTICASONE (FLONASE) 50 MCG/ACT NASAL SPRAY    Place 2 sprays into both nostrils daily.  Plan: 1. Test/x-ray results and diagnosis reviewed with patient 2. rx as per orders; risks, benefits, potential side effects reviewed with patient 3. Recommend supportive treatment with Adequate hydration and rest. Told her that this is most likely a viral illness and does not require antibiotics. We will treat her symptomatically. She did not want to have any codeine derivative cough syrups or I will give her Tessalon Perles. She is not improving she should follow-up with her primary care physician 4. F/u prn if symptoms worsen or don't improve     Lorin Picket, PA-C 07/24/15 1626

## 2015-07-24 NOTE — ED Notes (Signed)
Patient started having symptoms of cough, nasal congestion and sore throat 3 days ago. Sore throat has resolved.

## 2015-08-15 DIAGNOSIS — K5732 Diverticulitis of large intestine without perforation or abscess without bleeding: Secondary | ICD-10-CM | POA: Diagnosis not present

## 2015-08-15 DIAGNOSIS — I10 Essential (primary) hypertension: Secondary | ICD-10-CM | POA: Diagnosis not present

## 2015-08-15 DIAGNOSIS — I119 Hypertensive heart disease without heart failure: Secondary | ICD-10-CM | POA: Diagnosis not present

## 2015-08-15 DIAGNOSIS — E784 Other hyperlipidemia: Secondary | ICD-10-CM | POA: Diagnosis not present

## 2015-10-17 DIAGNOSIS — M47812 Spondylosis without myelopathy or radiculopathy, cervical region: Secondary | ICD-10-CM | POA: Diagnosis not present

## 2015-10-17 DIAGNOSIS — S43429A Sprain of unspecified rotator cuff capsule, initial encounter: Secondary | ICD-10-CM | POA: Diagnosis not present

## 2015-10-17 DIAGNOSIS — Z23 Encounter for immunization: Secondary | ICD-10-CM | POA: Diagnosis not present

## 2015-10-17 DIAGNOSIS — K219 Gastro-esophageal reflux disease without esophagitis: Secondary | ICD-10-CM | POA: Diagnosis not present

## 2015-10-17 DIAGNOSIS — M792 Neuralgia and neuritis, unspecified: Secondary | ICD-10-CM | POA: Diagnosis not present

## 2016-01-02 ENCOUNTER — Other Ambulatory Visit: Payer: Self-pay | Admitting: Internal Medicine

## 2016-01-02 ENCOUNTER — Ambulatory Visit
Admission: RE | Admit: 2016-01-02 | Discharge: 2016-01-02 | Disposition: A | Discharge status: Home / Self Care | Payer: Commercial Managed Care - HMO | Source: Ambulatory Visit | Attending: Internal Medicine | Admitting: Internal Medicine

## 2016-01-02 DIAGNOSIS — I10 Essential (primary) hypertension: Secondary | ICD-10-CM | POA: Diagnosis not present

## 2016-01-02 DIAGNOSIS — M7989 Other specified soft tissue disorders: Secondary | ICD-10-CM | POA: Diagnosis not present

## 2016-01-02 DIAGNOSIS — K5732 Diverticulitis of large intestine without perforation or abscess without bleeding: Secondary | ICD-10-CM | POA: Diagnosis not present

## 2016-01-02 DIAGNOSIS — M19032 Primary osteoarthritis, left wrist: Secondary | ICD-10-CM | POA: Diagnosis not present

## 2016-01-02 DIAGNOSIS — M25532 Pain in left wrist: Secondary | ICD-10-CM

## 2016-01-02 DIAGNOSIS — S63502S Unspecified sprain of left wrist, sequela: Secondary | ICD-10-CM | POA: Diagnosis not present

## 2016-01-02 DIAGNOSIS — R937 Abnormal findings on diagnostic imaging of other parts of musculoskeletal system: Secondary | ICD-10-CM | POA: Insufficient documentation

## 2016-01-02 DIAGNOSIS — M76891 Other specified enthesopathies of right lower limb, excluding foot: Secondary | ICD-10-CM | POA: Diagnosis not present

## 2016-01-09 DIAGNOSIS — I10 Essential (primary) hypertension: Secondary | ICD-10-CM | POA: Diagnosis not present

## 2016-01-09 DIAGNOSIS — I119 Hypertensive heart disease without heart failure: Secondary | ICD-10-CM | POA: Diagnosis not present

## 2016-01-09 DIAGNOSIS — K219 Gastro-esophageal reflux disease without esophagitis: Secondary | ICD-10-CM | POA: Diagnosis not present

## 2016-01-09 DIAGNOSIS — K5732 Diverticulitis of large intestine without perforation or abscess without bleeding: Secondary | ICD-10-CM | POA: Diagnosis not present

## 2016-01-29 ENCOUNTER — Emergency Department: Payer: Commercial Managed Care - HMO

## 2016-01-29 ENCOUNTER — Emergency Department
Admission: EM | Admit: 2016-01-29 | Discharge: 2016-01-29 | Disposition: A | Discharge status: Home / Self Care | Payer: Commercial Managed Care - HMO | Attending: Emergency Medicine | Admitting: Emergency Medicine

## 2016-01-29 ENCOUNTER — Encounter: Payer: Self-pay | Admitting: Intensive Care

## 2016-01-29 DIAGNOSIS — E785 Hyperlipidemia, unspecified: Secondary | ICD-10-CM | POA: Diagnosis not present

## 2016-01-29 DIAGNOSIS — F419 Anxiety disorder, unspecified: Secondary | ICD-10-CM | POA: Insufficient documentation

## 2016-01-29 DIAGNOSIS — N390 Urinary tract infection, site not specified: Secondary | ICD-10-CM | POA: Diagnosis not present

## 2016-01-29 DIAGNOSIS — I10 Essential (primary) hypertension: Secondary | ICD-10-CM | POA: Diagnosis not present

## 2016-01-29 DIAGNOSIS — Z79899 Other long term (current) drug therapy: Secondary | ICD-10-CM | POA: Insufficient documentation

## 2016-01-29 DIAGNOSIS — R109 Unspecified abdominal pain: Secondary | ICD-10-CM | POA: Diagnosis not present

## 2016-01-29 HISTORY — DX: Unspecified osteoarthritis, unspecified site: M19.90

## 2016-01-29 LAB — URINALYSIS, COMPLETE (UACMP) WITH MICROSCOPIC
BILIRUBIN URINE: NEGATIVE
Glucose, UA: NEGATIVE mg/dL
Ketones, ur: NEGATIVE mg/dL
Nitrite: NEGATIVE
PH: 7 (ref 5.0–8.0)
Protein, ur: 30 mg/dL — AB
SQUAMOUS EPITHELIAL / LPF: NONE SEEN
Specific Gravity, Urine: 1.011 (ref 1.005–1.030)

## 2016-01-29 MED ORDER — PHENAZOPYRIDINE HCL 200 MG PO TABS: 200.0000 mg | ORAL_TABLET | Freq: Three times a day (TID) | ORAL | 0 refills | Status: DC | PRN

## 2016-01-29 MED ORDER — SULFAMETHOXAZOLE-TRIMETHOPRIM 800-160 MG PO TABS
1.0000 | ORAL_TABLET | Freq: Once | ORAL | Status: AC
  Administered 2016-01-29: 1 via ORAL
  Filled 2016-01-29: qty 1

## 2016-01-29 MED ORDER — PHENAZOPYRIDINE HCL 200 MG PO TABS
200.0000 mg | ORAL_TABLET | Freq: Once | ORAL | Status: AC
  Administered 2016-01-29: 200 mg via ORAL
  Filled 2016-01-29: qty 1

## 2016-01-29 MED ORDER — SULFAMETHOXAZOLE-TRIMETHOPRIM 800-160 MG PO TABS: 1.0000 | ORAL_TABLET | Freq: Two times a day (BID) | ORAL | 0 refills | Status: DC

## 2016-01-29 MED ORDER — KETOROLAC TROMETHAMINE 60 MG/2ML IM SOLN
30.0000 mg | Freq: Once | INTRAMUSCULAR | Status: AC
  Administered 2016-01-29: 30 mg via INTRAMUSCULAR
  Filled 2016-01-29: qty 2

## 2016-01-29 NOTE — ED Provider Notes (Signed)
[facility] Emergency Department Provider Note   ____________________________________________   First MD Initiated Contact with Patient 01/29/16 1526     (approximate)  I have reviewed the triage vital signs and the nursing notes.   HISTORY  Chief Complaint Urinary Tract Infection    HPI Sandra Dudley is a 66 y.o. female 2 days of right flank pain with dysuria, frequency and urgency. Patient denies fever or vaginal discharge. Patient state history kidney stones. She stated mild transient relief with BC powders. Patient rates the pain at 6/10. Patient described a pain as pressure and sharp.   Past Medical History:  Diagnosis Date  . Anxiety   . Arthritis   . Chronic pain   . Enlarged heart   . High cholesterol   . Hypertension     There are no active problems to display for this patient.   Past Surgical History:  Procedure Laterality Date  . BACK SURGERY    . KNEE SURGERY    . SHOULDER SURGERY      Prior to Admission medications   Medication Sig Start Date End Date Taking? Authorizing Provider  albuterol (PROVENTIL HFA;VENTOLIN HFA) 108 (90 Base) MCG/ACT inhaler Inhale 1 puff into the lungs every 6 (six) hours as needed for wheezing or shortness of breath.    Historical Provider, MD  ALPRAZolam Duanne Moron) 0.25 MG tablet Take 0.25 mg by mouth at bedtime as needed for anxiety.    Historical Provider, MD  benzonatate (TESSALON) 200 MG capsule Take 1 capsule (200 mg total) by mouth 3 (three) times daily. 07/24/15   Lorin Picket, PA-C  budesonide-formoterol (SYMBICORT) 80-4.5 MCG/ACT inhaler Inhale 2 puffs into the lungs 2 (two) times daily.    Historical Provider, MD  fenofibrate (TRICOR) 145 MG tablet Take 145 mg by mouth daily.    Historical Provider, MD  fluticasone (FLONASE) 50 MCG/ACT nasal spray Place 2 sprays into both nostrils daily. 07/24/15   Lorin Picket, PA-C  lisinopril-hydrochlorothiazide (PRINZIDE,ZESTORETIC) 20-12.5 MG  tablet Take 1 tablet by mouth daily.    Historical Provider, MD  omeprazole (PRILOSEC) 40 MG capsule Take 40 mg by mouth daily.    Historical Provider, MD  phenazopyridine (PYRIDIUM) 200 MG tablet Take 1 tablet (200 mg total) by mouth 3 (three) times daily as needed for pain. 01/29/16   Sable Feil, PA-C  primidone (MYSOLINE) 50 MG tablet Take 50 mg by mouth 2 (two) times daily.    Historical Provider, MD  sulfamethoxazole-trimethoprim (BACTRIM DS,SEPTRA DS) 800-160 MG tablet Take 1 tablet by mouth 2 (two) times daily. 01/29/16   Sable Feil, PA-C  venlafaxine (EFFEXOR) 75 MG tablet Take 75 mg by mouth 2 (two) times daily.    Historical Provider, MD    Allergies Codeine and Penicillins  Family History  Problem Relation Age of Onset  . Hypertension Mother   . Cancer Father     Social History Social History  Substance Use Topics  . Smoking status: Never Smoker  . Smokeless tobacco: Never Used  . Alcohol use No    Review of Systems Constitutional: No fever/chills Eyes: No visual changes. ENT: No sore throat. Cardiovascular: Denies chest pain. Respiratory: Denies shortness of breath. Gastrointestinal: No abdominal pain.  No nausea, no vomiting.  No diarrhea.  No constipation. Genitourinary: Positive for dysuria, urgency). Musculoskeletal: Positive for right flank pain Skin: Negative for rash. Neurological: Negative for headaches, focal weakness or numbness. Psychiatric:Anxiety Endocrine: Hypertension hyperlipidemia Allergic/Immunilogical: Codeine and penicillin  ____________________________________________   PHYSICAL EXAM:  VITAL SIGNS: ED Triage Vitals  Enc Vitals Group     BP 01/29/16 1450 (!) 145/59     Pulse Rate 01/29/16 1450 79     Resp 01/29/16 1450 20     Temp 01/29/16 1450 97.9 F (36.6 C)     Temp Source 01/29/16 1450 Oral     SpO2 01/29/16 1450 99 %     Weight 01/29/16 1452 221 lb (100.2 kg)     Height 01/29/16 1452 5\' 5"  (1.651 m)     Head  Circumference --      Peak Flow --      Pain Score --      Pain Loc --      Pain Edu? --      Excl. in [place]? --     Constitutional: Alert and oriented. Well appearing and in no acute distress. Eyes: Conjunctivae are normal. PERRL. EOMI. Head: Atraumatic. Nose: No congestion/rhinnorhea. Mouth/Throat: Mucous membranes are moist.  Oropharynx non-erythematous. Neck: No stridor.  No cervical spine tenderness to palpation. Hematological/Lymphatic/Immunilogical: No cervical lymphadenopathy. Cardiovascular: Normal rate, regular rhythm. Grossly normal heart sounds.  Good peripheral circulation. Respiratory: Normal respiratory effort.  No retractions. Lungs CTAB. Gastrointestinal: Soft and nontender. No distention. No abdominal bruits. No CVA tenderness. Genitourinary: Deferred Musculoskeletal: No lower extremity tenderness nor edema.  No joint effusions. Neurologic:  Normal speech and language. No gross focal neurologic deficits are appreciated. No gait instability. Skin:  Skin is warm, dry and intact. No rash noted. Psychiatric: Mood and affect are normal. Speech and behavior are normal.  ____________________________________________   LABS (all labs ordered are listed, but only abnormal results are displayed)  Labs Reviewed  URINALYSIS, COMPLETE (UACMP) WITH MICROSCOPIC - Abnormal; Notable for the following:       Result Value   Color, Urine YELLOW (*)    APPearance HAZY (*)    Hgb urine dipstick LARGE (*)    Protein, ur 30 (*)    Leukocytes, UA LARGE (*)    Bacteria, UA RARE (*)    All other components within normal limits   ____________________________________________  EKG   ____________________________________________  RADIOLOGY  No acute findings on renal CT scan. ____________________________________________   PROCEDURES  Procedure(s) performed: None  Procedures  Critical Care performed: No  ____________________________________________   INITIAL IMPRESSION /  ASSESSMENT AND PLAN / ED COURSE  Pertinent labs & imaging results that were available during my care of the patient were reviewed by me and considered in my medical decision making (see chart for details).  Urinary tract infection. Patient given discharge care instructions. Patient given a prescription for Bactrim DS and Pyridium. Patient advised take over-the-counter Tylenol as needed for pain. Advised follow-up with family doctor in 10 days for chest urine.  Clinical Course      ____________________________________________   FINAL CLINICAL IMPRESSION(S) / ED DIAGNOSES  Final diagnoses:  Lower urinary tract infectious disease      NEW MEDICATIONS STARTED DURING THIS VISIT:  New Prescriptions   PHENAZOPYRIDINE (PYRIDIUM) 200 MG TABLET    Take 1 tablet (200 mg total) by mouth 3 (three) times daily as needed for pain.   SULFAMETHOXAZOLE-TRIMETHOPRIM (BACTRIM DS,SEPTRA DS) 800-160 MG TABLET    Take 1 tablet by mouth 2 (two) times daily.     Note:  This document was prepared using Dragon voice recognition software and may include unintentional dictation errors.    Sable Feil, PA-C 01/29/16 [place]  Jimmye Norman, MD 01/30/16 [phone]

## 2016-01-29 NOTE — ED Triage Notes (Addendum)
Pt presents to ER with lower back pain and burning with urination since Wednesday. Denies abnormal d/c. Pt ambulatory.

## 2016-01-29 NOTE — ED Notes (Signed)
See triage note  States she developed lower back pain with some urinary freq and pain couple of days ago

## 2016-04-26 DIAGNOSIS — K219 Gastro-esophageal reflux disease without esophagitis: Secondary | ICD-10-CM | POA: Diagnosis not present

## 2016-04-26 DIAGNOSIS — K5732 Diverticulitis of large intestine without perforation or abscess without bleeding: Secondary | ICD-10-CM | POA: Diagnosis not present

## 2016-04-26 DIAGNOSIS — I10 Essential (primary) hypertension: Secondary | ICD-10-CM | POA: Diagnosis not present

## 2016-05-09 ENCOUNTER — Ambulatory Visit
Admission: EM | Admit: 2016-05-09 | Discharge: 2016-05-09 | Disposition: A | Discharge status: Home / Self Care | Payer: Medicare HMO | Attending: Family Medicine | Admitting: Family Medicine

## 2016-05-09 DIAGNOSIS — M545 Low back pain: Secondary | ICD-10-CM | POA: Diagnosis not present

## 2016-05-09 DIAGNOSIS — M5441 Lumbago with sciatica, right side: Secondary | ICD-10-CM

## 2016-05-09 MED ORDER — PREDNISONE 10 MG (21) PO TBPK: ORAL_TABLET | Freq: Every day | ORAL | 0 refills | Status: DC

## 2016-05-09 MED ORDER — METHYLPREDNISOLONE SODIUM SUCC 125 MG IJ SOLR
80.0000 mg | Freq: Once | INTRAMUSCULAR | Status: AC
  Administered 2016-05-09: 80 mg via INTRAMUSCULAR

## 2016-05-09 NOTE — ED Provider Notes (Signed)
[facility]    CSN: [id number] Arrival date & time: 05/09/16  1516  History   Chief Complaint Chief Complaint  Patient presents with  . Back Pain   HPI 66 year old female presents with complaints of low back pain.  Patient has a history of lumbar fusion, L3 - L5. Patient states that for the past 3 weeks she's had ongoing right low back pain. She reports associated radiation down her right leg. No numbness obtaining. Pain is moderate to severe. She's been using Aleve and tramadol without improvement. Patient states that it possibly started after she lifted a mop bucket filled with water. No other associated symptoms. No other complaints or concerns at this time.   Past Medical History:  Diagnosis Date  . Anxiety   . Arthritis   . Chronic pain   . Enlarged heart   . High cholesterol   . Hypertension    Past Surgical History:  Procedure Laterality Date  . BACK SURGERY    . KNEE SURGERY    . SHOULDER SURGERY      OB History    No data available     Home Medications    Prior to Admission medications   Medication Sig Start Date End Date Taking? Authorizing Provider  albuterol (PROVENTIL HFA;VENTOLIN HFA) 108 (90 Base) MCG/ACT inhaler Inhale 1 puff into the lungs every 6 (six) hours as needed for wheezing or shortness of breath.   Yes Historical Provider, MD  ALPRAZolam Duanne Moron) 0.25 MG tablet Take 0.25 mg by mouth at bedtime as needed for anxiety.   Yes Historical Provider, MD  budesonide-formoterol (SYMBICORT) 80-4.5 MCG/ACT inhaler Inhale 2 puffs into the lungs 2 (two) times daily.   Yes Historical Provider, MD  fenofibrate (TRICOR) 145 MG tablet Take 145 mg by mouth daily.   Yes Historical Provider, MD  fluticasone (FLONASE) 50 MCG/ACT nasal spray Place 2 sprays into both nostrils daily. 07/24/15  Yes Lorin Picket, PA-C  lisinopril-hydrochlorothiazide (PRINZIDE,ZESTORETIC) 20-12.5 MG tablet Take 1 tablet by mouth daily.   Yes Historical Provider, MD    omeprazole (PRILOSEC) 40 MG capsule Take 40 mg by mouth daily.   Yes Historical Provider, MD  primidone (MYSOLINE) 50 MG tablet Take 50 mg by mouth 2 (two) times daily.   Yes Historical Provider, MD  traMADol (ULTRAM) 50 MG tablet Take by mouth every 6 (six) hours as needed.   Yes Historical Provider, MD  venlafaxine (EFFEXOR) 75 MG tablet Take 75 mg by mouth 2 (two) times daily.   Yes Historical Provider, MD  benzonatate (TESSALON) 200 MG capsule Take 1 capsule (200 mg total) by mouth 3 (three) times daily. 07/24/15   Lorin Picket, PA-C  phenazopyridine (PYRIDIUM) 200 MG tablet Take 1 tablet (200 mg total) by mouth 3 (three) times daily as needed for pain. 01/29/16   Sable Feil, PA-C  predniSONE (STERAPRED UNI-PAK 21 TAB) 10 MG (21) TBPK tablet Take by mouth daily. Take 6 tabs by mouth daily  for 2 days, then 5 tabs for 2 days, then 4 tabs for 2 days, then 3 tabs for 2 days, 2 tabs for 2 days, then 1 tab by mouth daily for 2 days 05/09/16   Coral Spikes, DO  sulfamethoxazole-trimethoprim (BACTRIM DS,SEPTRA DS) 800-160 MG tablet Take 1 tablet by mouth 2 (two) times daily. 01/29/16   Sable Feil, PA-C    Family History Family History  Problem Relation Age of Onset  . Hypertension Mother   . Cancer Father  Social History Social History  Substance Use Topics  . Smoking status: Never Smoker  . Smokeless tobacco: Never Used  . Alcohol use No     Allergies   Codeine and Penicillins   Review of Systems Review of Systems  Musculoskeletal: Positive for back pain.  All other systems reviewed and are negative.  Physical Exam Triage Vital Signs ED Triage Vitals  Enc Vitals Group     BP 05/09/16 1558 (!) 123/56     Pulse Rate 05/09/16 1558 72     Resp 05/09/16 1558 16     Temp 05/09/16 1558 98.1 F (36.7 C)     Temp Source 05/09/16 1558 Oral     SpO2 05/09/16 1558 98 %     Weight 05/09/16 1555 206 lb (93.4 kg)     Height 05/09/16 1555 5\' 5"  (1.651 m)     Head  Circumference --      Peak Flow --      Pain Score 05/09/16 1555 6     Pain Loc --      Pain Edu? --      Excl. in [place]? --    Updated Vital Signs BP (!) 123/56 (BP Location: Left Arm)   Pulse 72   Temp 98.1 F (36.7 C) (Oral)   Resp 16   Ht 5\' 5"  (1.651 m)   Wt 206 lb (93.4 kg)   SpO2 98%   BMI 34.28 kg/m  Physical Exam  Constitutional: She is oriented to person, place, and time. She appears well-developed. No distress.  HENT:  Head: Normocephalic and atraumatic.  Eyes: Conjunctivae are normal.  Neck: Normal range of motion.  Cardiovascular: Normal rate and regular rhythm.   Pulmonary/Chest: Effort normal.  Abdominal: She exhibits no distension.  Musculoskeletal:  Mild paraspinal musculature tenderness of the right low back. Negative straight leg raise.  Neurological: She is alert and oriented to person, place, and time.  Skin: No rash noted.  Psychiatric: She has a normal mood and affect.  Vitals reviewed.  UC Treatments / Results  Labs (all labs ordered are listed, but only abnormal results are displayed) Labs Reviewed - No data to display  EKG  EKG Interpretation None       Radiology No results found.  Procedures Procedures (including critical care time)  Medications Ordered in UC Medications  methylPREDNISolone sodium succinate (SOLU-MEDROL) 125 mg/2 mL injection 80 mg (80 mg Intramuscular Given 05/09/16 1630)     Initial Impression / Assessment and Plan / UC Course  I have reviewed the triage vital signs and the nursing notes.  Pertinent labs & imaging results that were available during my care of the patient were reviewed by me and considered in my medical decision making (see chart for details).    66 year old female presents with complaints of low back pain. Treating with IM injection and oral prednisone.  Final Clinical Impressions(s) / UC Diagnoses   Final diagnoses:  Acute right-sided low back pain with right-sided sciatica    New  Prescriptions New Prescriptions   PREDNISONE (STERAPRED UNI-PAK 21 TAB) 10 MG (21) TBPK TABLET    Take by mouth daily. Take 6 tabs by mouth daily  for 2 days, then 5 tabs for 2 days, then 4 tabs for 2 days, then 3 tabs for 2 days, 2 tabs for 2 days, then 1 tab by mouth daily for 2 days     Coral Spikes, DO 05/09/16 1640

## 2016-05-09 NOTE — Discharge Instructions (Signed)
Take the prednisone starting tomorrow.  If you worsen or fail to improve, please see your primary care doctor.  Take care  Dr. Lacinda Axon

## 2016-05-09 NOTE — ED Triage Notes (Signed)
Patient complains of low back pain x 3 weeks. Patient states that she has had surgery on low back in the past. Patient states that she has tried using ointments recently without relief. Patient denies any known injury to the back.

## 2016-05-14 DIAGNOSIS — M19079 Primary osteoarthritis, unspecified ankle and foot: Secondary | ICD-10-CM | POA: Diagnosis not present

## 2016-05-14 DIAGNOSIS — M7751 Other enthesopathy of right foot: Secondary | ICD-10-CM | POA: Diagnosis not present

## 2016-05-14 DIAGNOSIS — M7752 Other enthesopathy of left foot: Secondary | ICD-10-CM | POA: Diagnosis not present

## 2016-05-28 ENCOUNTER — Ambulatory Visit
Admission: EM | Admit: 2016-05-28 | Discharge: 2016-05-28 | Disposition: A | Discharge status: Home / Self Care | Payer: Medicare HMO | Attending: Emergency Medicine | Admitting: Emergency Medicine

## 2016-05-28 ENCOUNTER — Encounter: Payer: Self-pay | Admitting: Emergency Medicine

## 2016-05-28 ENCOUNTER — Telehealth: Payer: Self-pay

## 2016-05-28 DIAGNOSIS — M5432 Sciatica, left side: Secondary | ICD-10-CM | POA: Diagnosis not present

## 2016-05-28 MED ORDER — CAMPHOR-MENTHOL-METHYL SAL 1.2-5.7-6.3 % EX PTCH: 1.0000 | MEDICATED_PATCH | Freq: Three times a day (TID) | CUTANEOUS | 0 refills | Status: DC

## 2016-05-28 MED ORDER — METHYLPREDNISOLONE 4 MG PO TBPK: ORAL_TABLET | ORAL | 0 refills | Status: DC

## 2016-05-28 MED ORDER — IBUPROFEN 600 MG PO TABS: 600.0000 mg | ORAL_TABLET | Freq: Four times a day (QID) | ORAL | 0 refills | Status: DC | PRN

## 2016-05-28 MED ORDER — METHYLPREDNISOLONE SODIUM SUCC 125 MG IJ SOLR
125.0000 mg | Freq: Once | INTRAMUSCULAR | Status: AC
  Administered 2016-05-28: 125 mg via INTRAMUSCULAR

## 2016-05-28 MED ORDER — METAXALONE 800 MG PO TABS: 800.0000 mg | ORAL_TABLET | Freq: Three times a day (TID) | ORAL | 0 refills | Status: DC

## 2016-05-28 NOTE — ED Triage Notes (Signed)
Patient c/o pain in her left hip that goes down her left leg into her calf for a week.

## 2016-05-28 NOTE — ED Notes (Signed)
Patient shows no signs of adverse reaction to medication at this time.  

## 2016-05-28 NOTE — ED Provider Notes (Signed)
HPI  SUBJECTIVE:  Sandra Dudley is a 66 y.o. female who presents with  left-sided sciatica nerve pain for the past 4-5 days. She states that it radiates down her posterior leg to her calf. States it is dull, sticking, intermittent, lasting hours. She denies back pain. She has been taking Tylenol combined with 400 mg ibuprofen, tramadol, ice which have not been helping. Symptoms are worse with sitting and walking for prolonged periods of time. She denies hip pain, recent lifting, twisting, fall. No numbness, tingling along her leg, leg weakness. No saddle anesthesia, fevers, rash. No abdominal pain. No calf swelling. She does report calf cramping and states that it feels "tense". She denies pain along the top of her foot. No unintentional weight loss or pain worse at night. She has had identical symptoms like this before with sciatic nerve inflammation. States that she was here last month for similar symptoms was given a shot of steroids which seemed to help a lot. She has a past medical history of bilateral total knee replacement, L3-L5 fusion, hypertension, chickenpox. She did get a single shot. No history of diabetes, GI bleed, kidney disease. CZY:SAYTKZ,SWFUX, MD   Patient was seen here at this clinic on 4/15 for right low back pain. sent home with prednisone, which she states made her vomit  Past Medical History:  Diagnosis Date  . Anxiety   . Arthritis   . Chronic pain   . Enlarged heart   . High cholesterol   . Hypertension     Past Surgical History:  Procedure Laterality Date  . BACK SURGERY    . KNEE SURGERY    . SHOULDER SURGERY      Family History  Problem Relation Age of Onset  . Hypertension Mother   . Cancer Father     Social History  Substance Use Topics  . Smoking status: Never Smoker  . Smokeless tobacco: Never Used  . Alcohol use No    No current facility-administered medications for this encounter.   Current Outpatient Prescriptions:  .  albuterol  (PROVENTIL HFA;VENTOLIN HFA) 108 (90 Base) MCG/ACT inhaler, Inhale 1 puff into the lungs every 6 (six) hours as needed for wheezing or shortness of breath., Disp: , Rfl:  .  ALPRAZolam (XANAX) 0.25 MG tablet, Take 0.25 mg by mouth at bedtime as needed for anxiety., Disp: , Rfl:  .  budesonide-formoterol (SYMBICORT) 80-4.5 MCG/ACT inhaler, Inhale 2 puffs into the lungs 2 (two) times daily., Disp: , Rfl:  .  Camphor-Menthol-Methyl Sal (HM SALONPAS PAIN RELIEF) 1.2-5.7-6.3 % PTCH, Apply 1 patch topically 3 (three) times daily. Leave in place for up to 12 hours., Disp: 30 patch, Rfl: 0 .  fenofibrate (TRICOR) 145 MG tablet, Take 145 mg by mouth daily., Disp: , Rfl:  .  fluticasone (FLONASE) 50 MCG/ACT nasal spray, Place 2 sprays into both nostrils daily., Disp: 16 g, Rfl: 0 .  ibuprofen (ADVIL,MOTRIN) 600 MG tablet, Take 1 tablet (600 mg total) by mouth every 6 (six) hours as needed., Disp: 30 tablet, Rfl: 0 .  lisinopril-hydrochlorothiazide (PRINZIDE,ZESTORETIC) 20-12.5 MG tablet, Take 1 tablet by mouth daily., Disp: , Rfl:  .  metaxalone (SKELAXIN) 800 MG tablet, Take 1 tablet (800 mg total) by mouth 3 (three) times daily., Disp: 21 tablet, Rfl: 0 .  methylPREDNISolone (MEDROL DOSEPAK) 4 MG TBPK tablet, follow package directions, Disp: 21 tablet, Rfl: 0 .  omeprazole (PRILOSEC) 40 MG capsule, Take 40 mg by mouth daily., Disp: , Rfl:  .  primidone (  MYSOLINE) 50 MG tablet, Take 50 mg by mouth 2 (two) times daily., Disp: , Rfl:  .  traMADol (ULTRAM) 50 MG tablet, Take by mouth every 6 (six) hours as needed., Disp: , Rfl:  .  venlafaxine (EFFEXOR) 75 MG tablet, Take 75 mg by mouth 2 (two) times daily., Disp: , Rfl:   Allergies  Allergen Reactions  . Codeine   . Penicillins Rash     ROS  As noted in HPI.   Physical Exam  BP 125/64 (BP Location: Right Arm)   Pulse 75   Temp 97.7 F (36.5 C) (Oral)   Resp 16   Ht 5\' 5"  (1.651 m)   Wt 221 lb (100.2 kg)   SpO2 98%   BMI 36.78 kg/m    Constitutional: Well developed, well nourished, no acute distress Eyes:  EOMI, conjunctiva normal bilaterally HENT: Normocephalic, atraumatic,mucus membranes moist Respiratory: Normal inspiratory effort Cardiovascular: Normal rate GI: nondistended skin: No rash, skin intact Musculoskeletal: No lumbar bony tenderness. No paralumbar tenderness, muscle spasm. No SI joint tenderness. Positive healed laminectomy surgical scar. No signs of trauma L hip. No bruising, erythema, rash. No muscular tenderness over gluteal muscles, down IT band, quadriceps. No pain with passive abduction/adduction of leg. No pain/pain with int/ext rotation hip. + tenderness at sciatic notch. SLR left side positive. Roll test for muscle spasm negative. Flexion/extension knee WNL. Knee joint NT, normal appearance. No calf tenderness or appreciable spasm.. Motor strength flexion/ext hip, knee, ankle, foot, EHL 5/5. Sensation to LT intact. DP 2+ Neurologic: Alert & oriented x 3, no focal neuro deficits Psychiatric: Speech and behavior appropriate   ED Course   Medications  methylPREDNISolone sodium succinate (SOLU-MEDROL) 125 mg/2 mL injection 125 mg (125 mg Intramuscular Given 05/28/16 1048)    No orders of the defined types were placed in this encounter.   No results found for this or any previous visit (from the past 24 hour(s)). No results found.  ED Clinical Impression  Sciatica of left side   ED Assessment/Plan  Previous records reviewed. As noted in history of present illness.  [facility] narcotic database reviewed. Patient was prescribed #60 tramadol 4/16 which is a months worth. She was also prescribed Xanax 0.25 mg #60 05/27/2016. Both prescriptions from the same provider over the past 6 months. It appears that she is on tramadol 50 mg bid chronically.  Patient states that the Solu-Medrol shot that she got here last time helped significantly. Do not think that she needs imaging because she has no  tenderness with range of motion of her head up and she denies any bony pain. Presentation most consistent with sciatic nerve irritation/inflammation. No evidence of shingles, septic joint. Home with methylprednisone dose pack, patient states prednisone made her vomit, we'll try a  muscle relaxant because she was reporting cramping pain in her posterior calf, 600 mg ibuprofen with 1 g of Tylenol 3-4 times a day as needed for pain. Salon Pas patch as well.  Discussed MDM, plan and followup with patient. Discussed sn/sx that should prompt return to the ED. Patient agrees with plan.   Meds ordered this encounter  Medications  . methylPREDNISolone sodium succinate (SOLU-MEDROL) 125 mg/2 mL injection 125 mg  . metaxalone (SKELAXIN) 800 MG tablet    Sig: Take 1 tablet (800 mg total) by mouth 3 (three) times daily.    Dispense:  21 tablet    Refill:  0  . ibuprofen (ADVIL,MOTRIN) 600 MG tablet    Sig: Take 1 tablet (  600 mg total) by mouth every 6 (six) hours as needed.    Dispense:  30 tablet    Refill:  0  . Camphor-Menthol-Methyl Sal (HM SALONPAS PAIN RELIEF) 1.2-5.7-6.3 % PTCH    Sig: Apply 1 patch topically 3 (three) times daily. Leave in place for up to 12 hours.    Dispense:  30 patch    Refill:  0  . methylPREDNISolone (MEDROL DOSEPAK) 4 MG TBPK tablet    Sig: follow package directions    Dispense:  21 tablet    Refill:  0    *This clinic note was created using [employer]. Therefore, there may be occasional mistakes despite careful proofreading.  ?   Melynda Ripple, MD 05/28/16 1102

## 2016-05-28 NOTE — Discharge Instructions (Signed)
600 mg ibuprofen with 1 g of Tylenol 3-4 times a day as needed for pain. Finiash the steroids, and try the muscle relaxant to see if this helps. Salon Pas patch as well.

## 2016-06-02 ENCOUNTER — Encounter: Payer: Self-pay | Admitting: Emergency Medicine

## 2016-06-02 ENCOUNTER — Emergency Department
Admission: EM | Admit: 2016-06-02 | Discharge: 2016-06-02 | Disposition: A | Discharge status: Home / Self Care | Payer: Medicare HMO | Attending: Emergency Medicine | Admitting: Emergency Medicine

## 2016-06-02 DIAGNOSIS — M5432 Sciatica, left side: Secondary | ICD-10-CM | POA: Insufficient documentation

## 2016-06-02 DIAGNOSIS — M25552 Pain in left hip: Secondary | ICD-10-CM | POA: Diagnosis present

## 2016-06-02 DIAGNOSIS — I10 Essential (primary) hypertension: Secondary | ICD-10-CM | POA: Diagnosis not present

## 2016-06-02 DIAGNOSIS — Z79899 Other long term (current) drug therapy: Secondary | ICD-10-CM | POA: Diagnosis not present

## 2016-06-02 MED ORDER — CYCLOBENZAPRINE HCL 5 MG PO TABS: 5.0000 mg | ORAL_TABLET | Freq: Every day | ORAL | 0 refills | Status: DC

## 2016-06-02 MED ORDER — KETOROLAC TROMETHAMINE 30 MG/ML IJ SOLN
30.0000 mg | Freq: Once | INTRAMUSCULAR | Status: AC
  Administered 2016-06-02: 30 mg via INTRAMUSCULAR
  Filled 2016-06-02: qty 1

## 2016-06-02 MED ORDER — DICLOFENAC SODIUM 50 MG PO TBEC: 50.0000 mg | DELAYED_RELEASE_TABLET | Freq: Two times a day (BID) | ORAL | 0 refills | Status: DC

## 2016-06-02 MED ORDER — KETOROLAC TROMETHAMINE 30 MG/ML IJ SOLN: 30.0000 mg | Freq: Once | INTRAMUSCULAR | Status: DC

## 2016-06-02 NOTE — ED Triage Notes (Signed)
Pt ambulatory to triage room with slow and steady gait with c/o left hip pain radiating down left leg. Pt reports had shot of steroid to left hip and was given steriod tabs for left hip on 5/4. Pt reports pain has not relieved.  Pt

## 2016-06-02 NOTE — ED Notes (Signed)
Pt reports that last month she had right hip pain and got a shot - now she is c/o left hip pain that radiates into left leg - pt reports it is sciatic pain

## 2016-06-02 NOTE — Discharge Instructions (Signed)
You are being treated for a flare of your sciatic nerve. Take the prescription meds as directed. Follow-up with your provider for continued symptoms. Return to the ED as needed.

## 2016-06-07 NOTE — ED Provider Notes (Signed)
[facility] Emergency Department Provider Note ____________________________________________  Time seen: 2030  I have reviewed the triage vital signs and the nursing notes.  HISTORY  Chief Complaint  Hip Pain  HPI Sandra Dudley is a 66 y.o. female presents to the ED for evaluation of left hip pain with radiation down the left leg. The patient gives a history of sciatica and reports that she had a recent steroid injection to the left hip as well as started on a taper dose of steroid pills on May 4. She reports this time her pain is unchanged since the steroid injection. She reports some pain to the left calf but denies any chest pain, shortness of breath, foot drop, or leg weakness. She has not been evaluated by a spine specialist since the onset of her symptoms. She denies any bladder or bowel incontinence or recent trauma.   Past Medical History:  Diagnosis Date  . Anxiety   . Arthritis   . Chronic pain   . Enlarged heart   . High cholesterol   . Hypertension     There are no active problems to display for this patient.   Past Surgical History:  Procedure Laterality Date  . BACK SURGERY    . KNEE SURGERY    . SHOULDER SURGERY      Prior to Admission medications   Medication Sig Start Date End Date Taking? Authorizing Provider  albuterol (PROVENTIL HFA;VENTOLIN HFA) 108 (90 Base) MCG/ACT inhaler Inhale 1 puff into the lungs every 6 (six) hours as needed for wheezing or shortness of breath.    [provider]  ALPRAZolam Duanne Moron) 0.25 MG tablet Take 0.25 mg by mouth at bedtime as needed for anxiety.    [provider]  budesonide-formoterol (SYMBICORT) 80-4.5 MCG/ACT inhaler Inhale 2 puffs into the lungs 2 (two) times daily.    [provider]  Camphor-Menthol-Methyl Sal (HM SALONPAS PAIN RELIEF) 1.2-5.7-6.3 % PTCH Apply 1 patch topically 3 (three) times daily. Leave in place for up to 12 hours. 05/28/16   Melynda Ripple, MD   cyclobenzaprine (FLEXERIL) 5 MG tablet Take 1 tablet (5 mg total) by mouth at bedtime. 06/02/16   Maniah Nading, Dannielle Karvonen, PA-C  diclofenac (VOLTAREN) 50 MG EC tablet Take 1 tablet (50 mg total) by mouth 2 (two) times daily. 06/02/16   Caelyn Route, Dannielle Karvonen, PA-C  fenofibrate (TRICOR) 145 MG tablet Take 145 mg by mouth daily.    [provider]  fluticasone (FLONASE) 50 MCG/ACT nasal spray Place 2 sprays into both nostrils daily. 07/24/15   Lorin Picket, PA-C  ibuprofen (ADVIL,MOTRIN) 600 MG tablet Take 1 tablet (600 mg total) by mouth every 6 (six) hours as needed. 05/28/16   Melynda Ripple, MD  lisinopril-hydrochlorothiazide (PRINZIDE,ZESTORETIC) 20-12.5 MG tablet Take 1 tablet by mouth daily.    [provider]  metaxalone (SKELAXIN) 800 MG tablet Take 1 tablet (800 mg total) by mouth 3 (three) times daily. 05/28/16   Melynda Ripple, MD  methylPREDNISolone (MEDROL DOSEPAK) 4 MG TBPK tablet follow package directions 05/28/16   Melynda Ripple, MD  omeprazole (PRILOSEC) 40 MG capsule Take 40 mg by mouth daily.    [provider]  primidone (MYSOLINE) 50 MG tablet Take 50 mg by mouth 2 (two) times daily.    [provider]  traMADol (ULTRAM) 50 MG tablet Take by mouth every 6 (six) hours as needed.    [provider]  venlafaxine (EFFEXOR) 75 MG tablet Take 75 mg  by mouth 2 (two) times daily.    [provider]    Allergies Codeine and Penicillins  Family History  Problem Relation Age of Onset  . Hypertension Mother   . Cancer Father     Social History Social History  Substance Use Topics  . Smoking status: Never Smoker  . Smokeless tobacco: Never Used  . Alcohol use No    Review of Systems  Constitutional: Negative for fever. Cardiovascular: Negative for chest pain. Respiratory: Negative for shortness of breath. Gastrointestinal: Negative for abdominal pain, vomiting and diarrhea. Genitourinary: Negative for  dysuria. Musculoskeletal: Negative for back pain. RLE pain as above Skin: Negative for rash. Neurological: Negative for headaches, focal weakness or numbness. ____________________________________________  PHYSICAL EXAM:  VITAL SIGNS: ED Triage Vitals  Enc Vitals Group     BP 06/02/16 1926 (!) 163/73     Pulse Rate 06/02/16 1926 87     Resp 06/02/16 1926 18     Temp 06/02/16 1926 98 F (36.7 C)     Temp Source 06/02/16 1926 Oral     SpO2 06/02/16 1926 99 %     Weight 06/02/16 1927 220 lb (99.8 kg)     Height 06/02/16 1927 5\' 5"  (1.651 m)     Head Circumference --      Peak Flow --      Pain Score 06/02/16 1926 9     Pain Loc --      Pain Edu? --      Excl. in [place]? --     Constitutional: Alert and oriented. Well appearing and in no distress. Head: Normocephalic and atraumatic. Cardiovascular: Normal rate, regular rhythm. Normal distal pulses. Respiratory: Normal respiratory effort. No wheezes/rales/rhonchi. Gastrointestinal: Soft and nontender. No distention. Musculoskeletal: Normal spinal alignment without midline tenderness, spasm, deformity, or step-off. Normal transition from supine to sit without assistance. Negative seated straight leg raise. Without any obvious deformity, cords, or muscle defects on the left. Nontender with normal range of motion in all extremities.  Neurologic:  Cranial nerves II through XII grossly intact. Normal LE DTRs bilaterally. Normal gait without ataxia. Normal speech and language. No gross focal neurologic deficits are appreciated. Skin:  Skin is warm, dry and intact. No rash noted. ____________________________________________  PROCEDURES  Toradol 30 mg IM ____________________________________________  INITIAL IMPRESSION / ASSESSMENT AND PLAN / ED COURSE  Patient reports movement of her symptoms following IM medication administration. She is discharged with instruction and management of left sciatic nerve irritation. She is discharged at this  time with prescriptions for Ultram and Flexeril. She will follow up with her primary care provider or return to the ED as needed. ____________________________________________  FINAL CLINICAL IMPRESSION(S) / ED DIAGNOSES  Final diagnoses:  Sciatica of left side      Carmie End, Dannielle Karvonen, PA-C 06/07/16 1443    Nance Pear, MD 06/07/16 1750

## 2016-06-08 DIAGNOSIS — M5442 Lumbago with sciatica, left side: Secondary | ICD-10-CM | POA: Diagnosis not present

## 2016-06-08 DIAGNOSIS — K219 Gastro-esophageal reflux disease without esophagitis: Secondary | ICD-10-CM | POA: Diagnosis not present

## 2016-06-08 DIAGNOSIS — I119 Hypertensive heart disease without heart failure: Secondary | ICD-10-CM | POA: Diagnosis not present

## 2016-06-08 DIAGNOSIS — I10 Essential (primary) hypertension: Secondary | ICD-10-CM | POA: Diagnosis not present

## 2016-06-12 DIAGNOSIS — Z981 Arthrodesis status: Secondary | ICD-10-CM | POA: Diagnosis not present

## 2016-06-12 DIAGNOSIS — I10 Essential (primary) hypertension: Secondary | ICD-10-CM | POA: Diagnosis not present

## 2016-06-12 DIAGNOSIS — Z880 Allergy status to penicillin: Secondary | ICD-10-CM | POA: Diagnosis not present

## 2016-06-12 DIAGNOSIS — M79605 Pain in left leg: Secondary | ICD-10-CM | POA: Diagnosis not present

## 2016-06-12 DIAGNOSIS — Z885 Allergy status to narcotic agent status: Secondary | ICD-10-CM | POA: Diagnosis not present

## 2016-06-12 DIAGNOSIS — E785 Hyperlipidemia, unspecified: Secondary | ICD-10-CM | POA: Diagnosis not present

## 2016-06-12 DIAGNOSIS — M5432 Sciatica, left side: Secondary | ICD-10-CM | POA: Diagnosis not present

## 2016-06-16 DIAGNOSIS — M5440 Lumbago with sciatica, unspecified side: Secondary | ICD-10-CM | POA: Diagnosis not present

## 2016-06-16 DIAGNOSIS — E669 Obesity, unspecified: Secondary | ICD-10-CM | POA: Diagnosis not present

## 2016-06-16 DIAGNOSIS — G8929 Other chronic pain: Secondary | ICD-10-CM | POA: Diagnosis not present

## 2016-06-16 DIAGNOSIS — M48061 Spinal stenosis, lumbar region without neurogenic claudication: Secondary | ICD-10-CM | POA: Diagnosis not present

## 2016-06-16 DIAGNOSIS — M545 Low back pain: Secondary | ICD-10-CM | POA: Diagnosis not present

## 2016-06-17 ENCOUNTER — Other Ambulatory Visit: Payer: Self-pay | Admitting: Orthopedic Surgery

## 2016-06-17 DIAGNOSIS — M48061 Spinal stenosis, lumbar region without neurogenic claudication: Secondary | ICD-10-CM

## 2016-06-17 DIAGNOSIS — M5440 Lumbago with sciatica, unspecified side: Secondary | ICD-10-CM

## 2016-06-17 DIAGNOSIS — M545 Low back pain, unspecified: Secondary | ICD-10-CM

## 2016-07-01 ENCOUNTER — Ambulatory Visit
Admission: RE | Admit: 2016-07-01 | Discharge: 2016-07-01 | Disposition: A | Discharge status: Home / Self Care | Payer: Medicare HMO | Source: Ambulatory Visit | Attending: Orthopedic Surgery | Admitting: Orthopedic Surgery

## 2016-07-01 DIAGNOSIS — M48061 Spinal stenosis, lumbar region without neurogenic claudication: Secondary | ICD-10-CM | POA: Insufficient documentation

## 2016-07-01 DIAGNOSIS — M5127 Other intervertebral disc displacement, lumbosacral region: Secondary | ICD-10-CM | POA: Diagnosis not present

## 2016-07-01 DIAGNOSIS — M47816 Spondylosis without myelopathy or radiculopathy, lumbar region: Secondary | ICD-10-CM | POA: Diagnosis not present

## 2016-07-01 DIAGNOSIS — M5440 Lumbago with sciatica, unspecified side: Secondary | ICD-10-CM | POA: Diagnosis not present

## 2016-07-01 DIAGNOSIS — M2578 Osteophyte, vertebrae: Secondary | ICD-10-CM | POA: Insufficient documentation

## 2016-07-01 DIAGNOSIS — M5126 Other intervertebral disc displacement, lumbar region: Secondary | ICD-10-CM | POA: Diagnosis not present

## 2016-07-01 DIAGNOSIS — D1809 Hemangioma of other sites: Secondary | ICD-10-CM | POA: Diagnosis not present

## 2016-07-01 DIAGNOSIS — Z981 Arthrodesis status: Secondary | ICD-10-CM | POA: Insufficient documentation

## 2016-07-01 DIAGNOSIS — M545 Low back pain, unspecified: Secondary | ICD-10-CM

## 2016-07-09 DIAGNOSIS — M5416 Radiculopathy, lumbar region: Secondary | ICD-10-CM | POA: Diagnosis not present

## 2016-07-09 DIAGNOSIS — Z6834 Body mass index (BMI) 34.0-34.9, adult: Secondary | ICD-10-CM | POA: Diagnosis not present

## 2016-08-03 IMAGING — CR DG CHEST 2V
1 series · 2 of 2 positions shown · non-contrast
Comparison: 04/09/2013

CLINICAL DATA: Fall on left side [REDACTED] walking the dog, pain under
left breast

EXAM:
CHEST  2 VIEW

[Series 1: dxr chest pa (or ap) and lateral · 0.14mm/px · 2 of 2 slices shown]
[im 1/2]
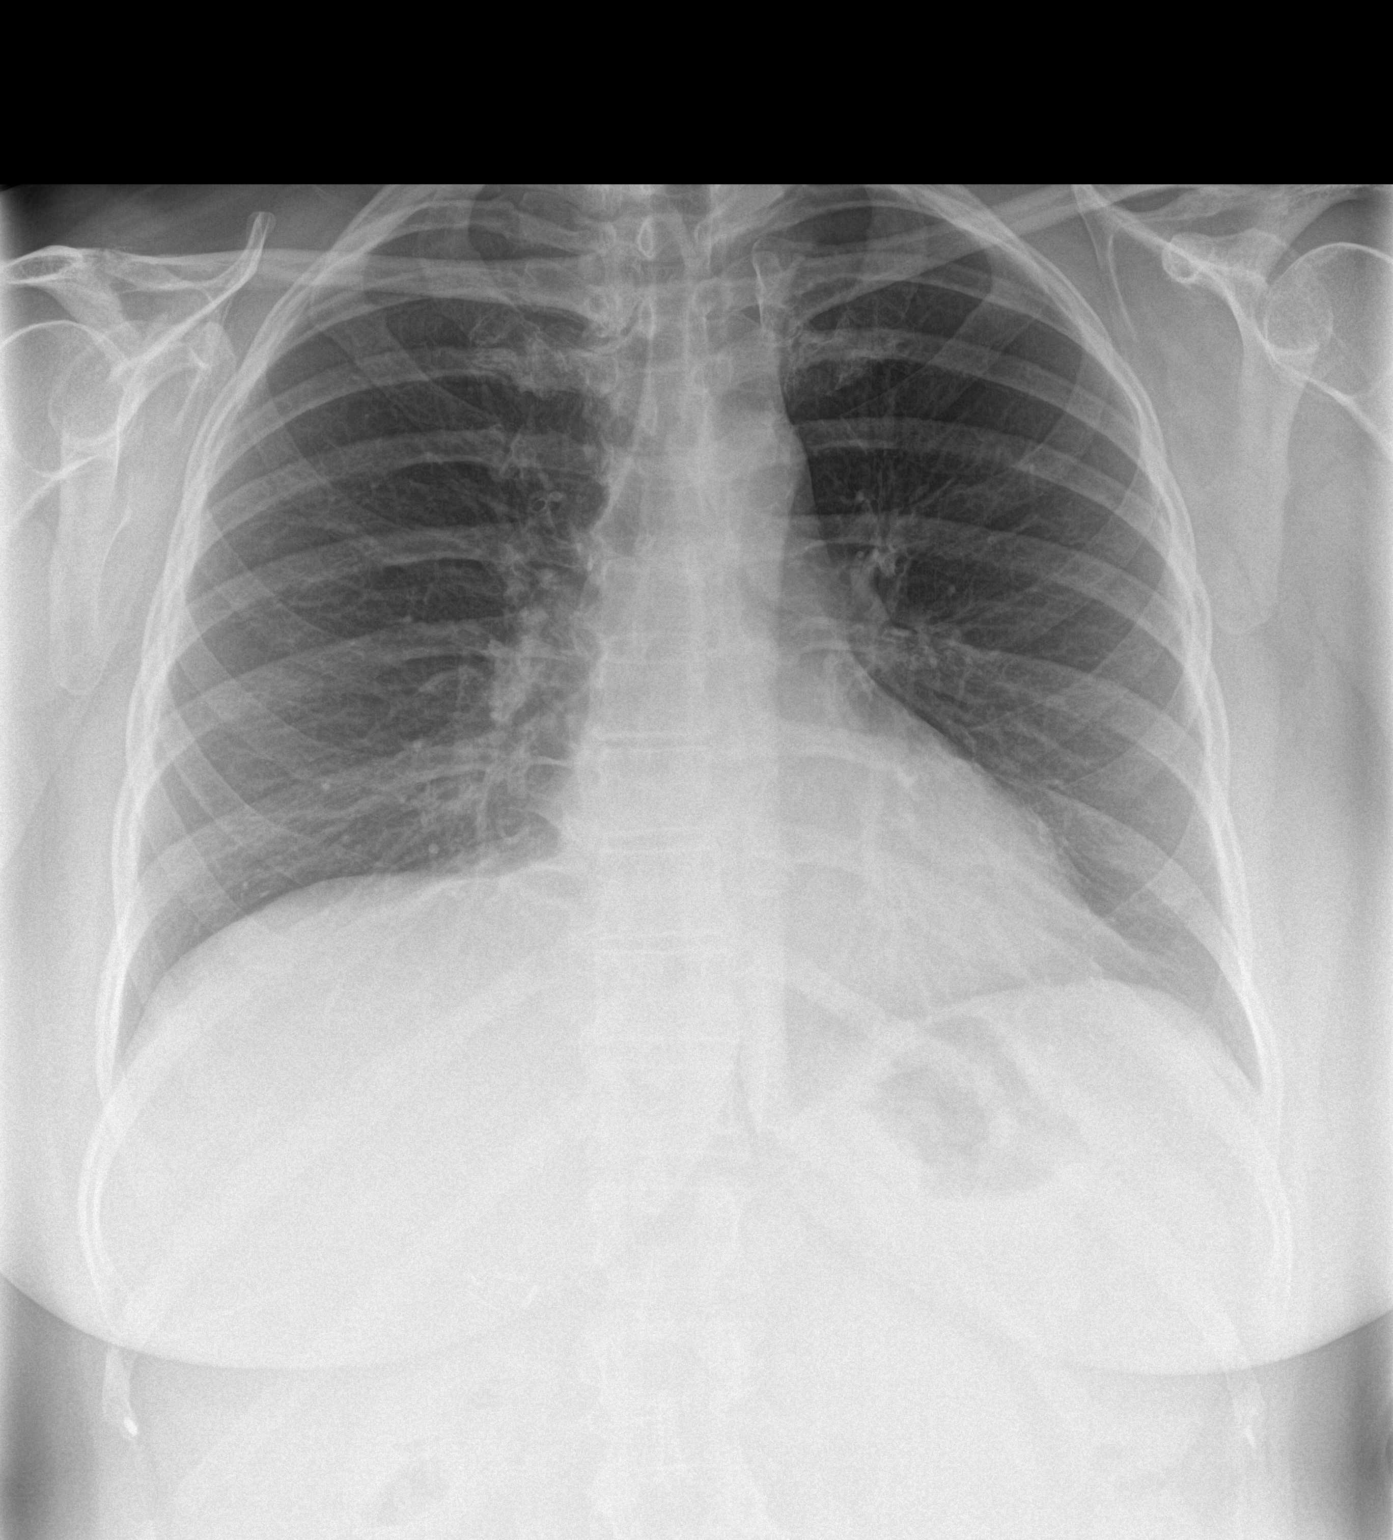
[im 2/2]
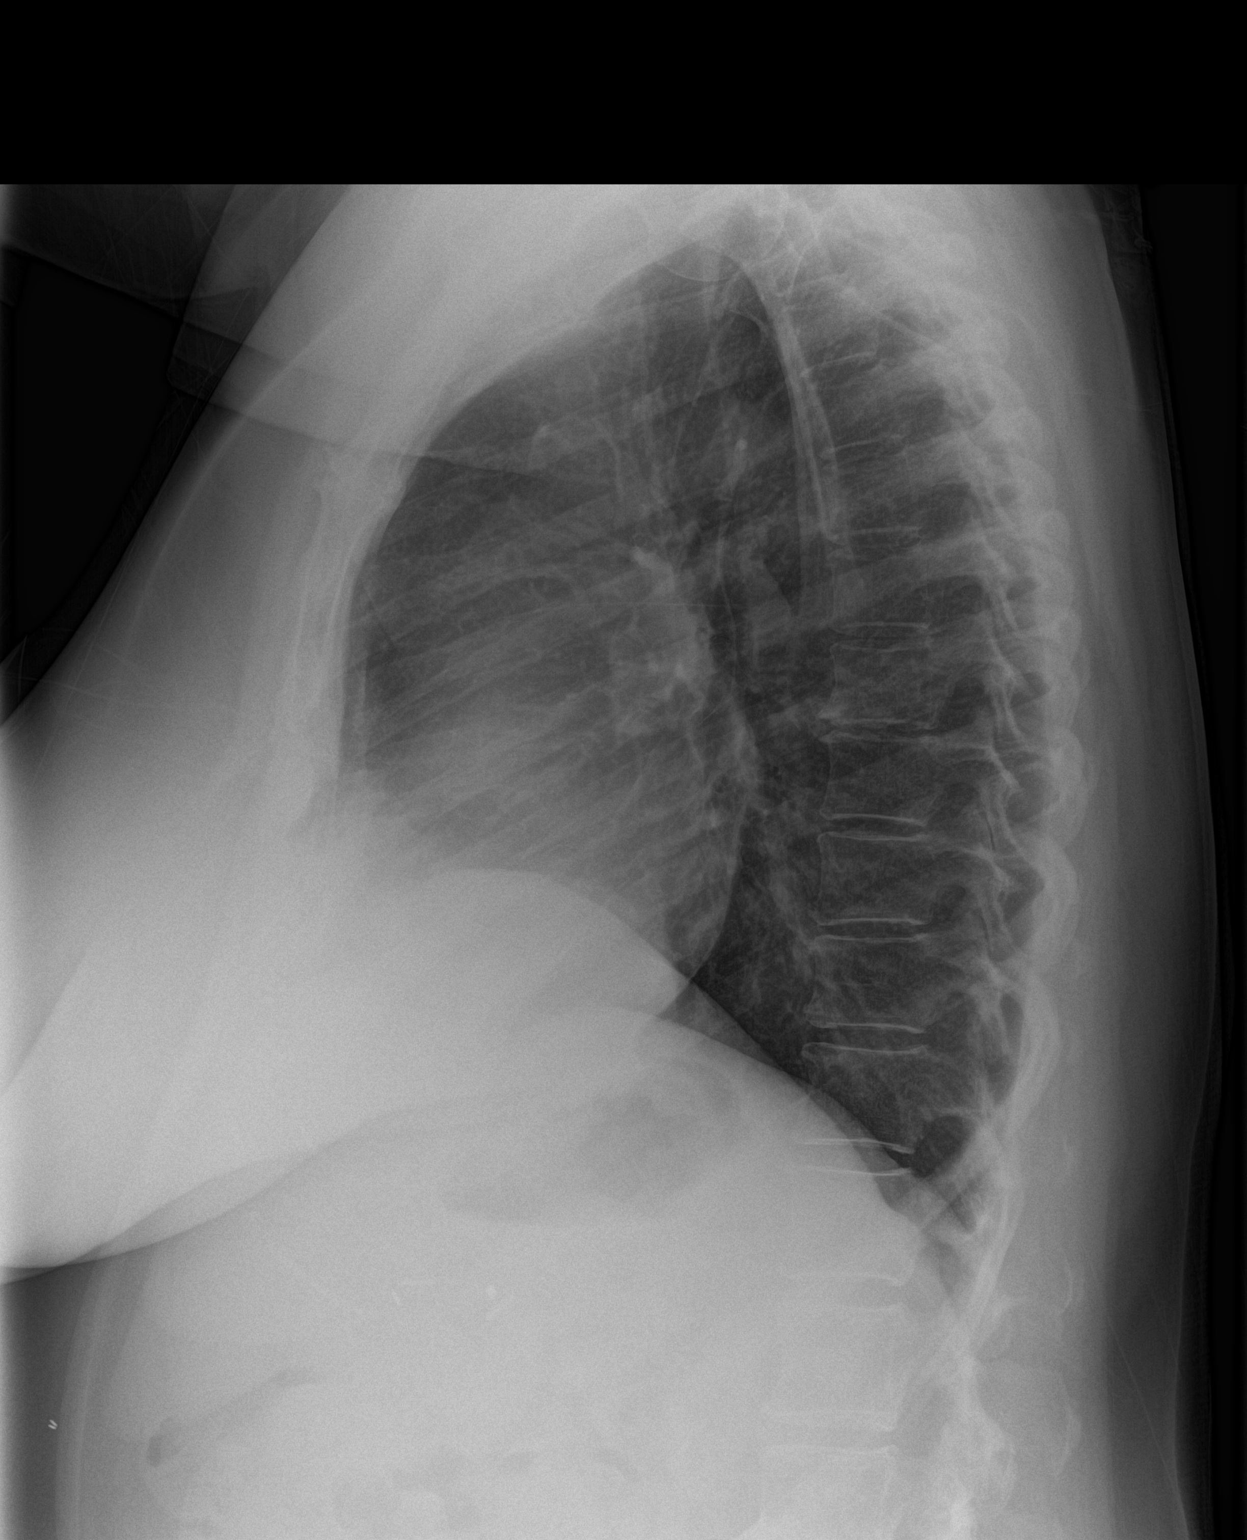

[2 of 2 positions shown; findings below may reference images not displayed]

FINDINGS: Cardiomediastinal silhouette is stable. No acute infiltrate or
pleural effusion. No pulmonary edema. Mild degenerative changes mid
and lower thoracic spine. No pneumothorax.
IMPRESSION: No active cardiopulmonary disease. Mild degenerative changes
thoracic spine.

## 2016-09-02 DIAGNOSIS — M5416 Radiculopathy, lumbar region: Secondary | ICD-10-CM | POA: Diagnosis not present

## 2016-09-13 DIAGNOSIS — I10 Essential (primary) hypertension: Secondary | ICD-10-CM | POA: Diagnosis not present

## 2016-09-13 DIAGNOSIS — M5416 Radiculopathy, lumbar region: Secondary | ICD-10-CM | POA: Diagnosis not present

## 2016-09-13 DIAGNOSIS — Z6834 Body mass index (BMI) 34.0-34.9, adult: Secondary | ICD-10-CM | POA: Diagnosis not present

## 2016-09-20 IMAGING — MR MRI LUMBAR SPINE WITHOUT CONTRAST
1 series · 5 of 5 positions shown · non-contrast
Comparison: Lumbar MRI 11/20/2007.  Abdominal pelvic CT 12/03/2010.

CLINICAL DATA: Low back pain radiating into the left leg with left
foot numbness for 1 month. Patient fell 2 months ago. History of
lumbar fusion.

EXAM:
MRI LUMBAR SPINE WITHOUT CONTRAST
TECHNIQUE: Multiplanar, multisequence MR imaging of the lumbar spine was
performed. No intravenous contrast was administered.

[Series 3: T2 · sagittal · 4.0mm · 0.81mm/px · 5 of 5 slices shown]
[im 1/5]
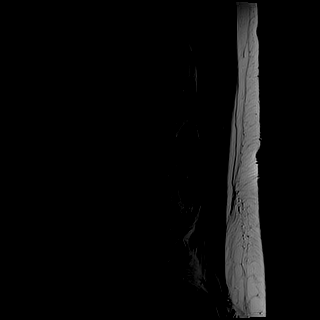
[im 2/5]
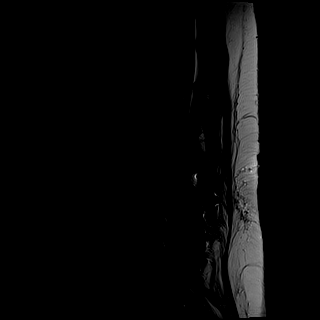
[im 3/5]
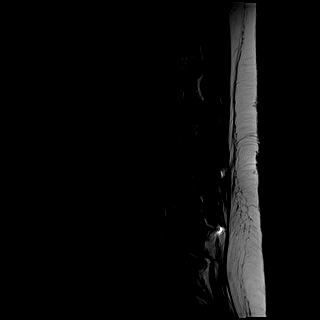
[im 4/5]
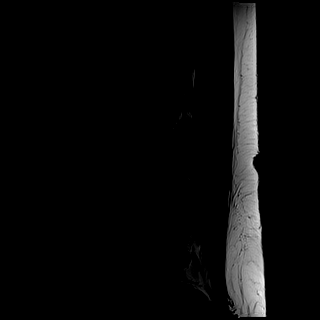
[im 5/5]
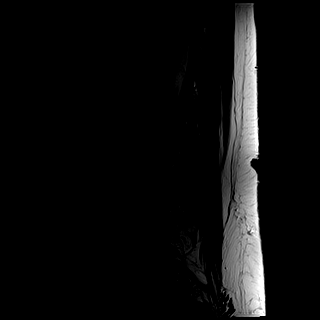

[5 of 5 positions shown; findings below may reference images not displayed]

FINDINGS: Five lumbar type vertebral bodies are assumed as on previous MRI.
Utilizing this numbering, there are no visible ribs at T12. The
patient is status post interbody fusion and interspinous distraction
device placement from L3 through L5. A grade 1 anterolisthesis at
L3-4 is improved compared with the prior MRI. There is a mild convex
left scoliosis. Disc and endplate degeneration have progressed at
L2-3. A hemangioma within the right aspect of the T12 vertebral body
has enlarged compared with the prior MRI.

The conus medullaris extends to the T12-L1 level and appears normal.
No paraspinal abnormalities are identified.

There are no significant disc space findings from T11-12 through
L1-2.

L2-3: Adjacent segment disease with interval significant loss of
disc height and endplate degeneration anteriorly. There is annular
disc bulging with mild facet and ligamentous hypertrophy. No
significant spinal stenosis or nerve root encroachment.

L3-4: Interval interbody fusion with some restoration of the disc
space height compared with the prior MRI. Mild spinal stenosis
appears improved. There is no foraminal compromise or L3 nerve root
encroachment.

L4-5: Interval interbody fusion with some restoration of the disc
space height compared with the prior MRI. Disc bulging and
osteophytes contribute to mild residual left greater than right
foraminal stenosis. There is mild asymmetric narrowing of the left
lateral recess.

L5-S1: Disc height is relatively maintained. There is a new lateral
foraminal disc herniation on the left associated with swelling of
the extraforaminal portion of the left L5 nerve root, best seen on
the axial T1 weighted image. There is mild bilateral facet
hypertrophy. The spinal canal, lateral recesses and right foramen
are patent.
IMPRESSION: 1. New lateral foraminal disc extrusion on the left at L5-S1 with
resulting swelling of the left L5 nerve root. This likely accounts
for the patient's current symptoms.
2. Interval interbody fusion and interspinous distraction from L3
through L5. The spinal stenosis appears improved at these levels
without definite residual nerve root encroachment.
3. Adjacent segment disease at L2-3 with interval significant loss
of disc height and endplate degeneration anteriorly. No associated
nerve root encroachment.

## 2016-09-29 ENCOUNTER — Encounter: Payer: Self-pay | Admitting: Student in an Organized Health Care Education/Training Program

## 2016-09-29 ENCOUNTER — Ambulatory Visit
Payer: Medicare HMO | Attending: Student in an Organized Health Care Education/Training Program | Admitting: Student in an Organized Health Care Education/Training Program

## 2016-09-29 VITALS — BP 140/54 | HR 88 | Temp 98.0°F | Resp 20 | Ht 65.0 in | Wt 207.2 lb

## 2016-09-29 DIAGNOSIS — M79605 Pain in left leg: Secondary | ICD-10-CM | POA: Diagnosis not present

## 2016-09-29 DIAGNOSIS — Z880 Allergy status to penicillin: Secondary | ICD-10-CM | POA: Diagnosis not present

## 2016-09-29 DIAGNOSIS — E7800 Pure hypercholesterolemia, unspecified: Secondary | ICD-10-CM | POA: Diagnosis not present

## 2016-09-29 DIAGNOSIS — M79672 Pain in left foot: Secondary | ICD-10-CM | POA: Diagnosis not present

## 2016-09-29 DIAGNOSIS — Z79899 Other long term (current) drug therapy: Secondary | ICD-10-CM | POA: Diagnosis not present

## 2016-09-29 DIAGNOSIS — G8929 Other chronic pain: Secondary | ICD-10-CM | POA: Insufficient documentation

## 2016-09-29 DIAGNOSIS — M545 Low back pain: Secondary | ICD-10-CM | POA: Diagnosis present

## 2016-09-29 DIAGNOSIS — M79671 Pain in right foot: Secondary | ICD-10-CM | POA: Diagnosis not present

## 2016-09-29 DIAGNOSIS — M5416 Radiculopathy, lumbar region: Secondary | ICD-10-CM | POA: Insufficient documentation

## 2016-09-29 DIAGNOSIS — I10 Essential (primary) hypertension: Secondary | ICD-10-CM | POA: Diagnosis not present

## 2016-09-29 DIAGNOSIS — M5417 Radiculopathy, lumbosacral region: Secondary | ICD-10-CM | POA: Diagnosis not present

## 2016-09-29 DIAGNOSIS — M5442 Lumbago with sciatica, left side: Secondary | ICD-10-CM

## 2016-09-29 DIAGNOSIS — Z885 Allergy status to narcotic agent status: Secondary | ICD-10-CM | POA: Diagnosis not present

## 2016-09-29 DIAGNOSIS — M25552 Pain in left hip: Secondary | ICD-10-CM | POA: Insufficient documentation

## 2016-09-29 DIAGNOSIS — M5136 Other intervertebral disc degeneration, lumbar region: Secondary | ICD-10-CM | POA: Diagnosis not present

## 2016-09-29 DIAGNOSIS — M5137 Other intervertebral disc degeneration, lumbosacral region: Secondary | ICD-10-CM | POA: Diagnosis not present

## 2016-09-29 DIAGNOSIS — M48061 Spinal stenosis, lumbar region without neurogenic claudication: Secondary | ICD-10-CM | POA: Insufficient documentation

## 2016-09-29 DIAGNOSIS — F419 Anxiety disorder, unspecified: Secondary | ICD-10-CM | POA: Insufficient documentation

## 2016-09-29 NOTE — Progress Notes (Deleted)
Patient's Name: Sandra Dudley  MRN: [id number]  Referring Provider: Eustace Moore, MD  DOB: 02/28/1950  PCP: Cletis Athens, MD  DOS: 09/29/2016  Note by: Gillis Santa, MD  Service setting: Ambulatory outpatient  Specialty: Interventional Pain Management  Location: [facility] (AMB) Pain Management Facility  Visit type: Initial Patient Evaluation  Patient type: New Patient   Primary Reason(s) for Visit: Encounter for initial evaluation of one or more chronic problems (new to examiner) potentially causing chronic pain, and posing a threat to normal musculoskeletal function. (Level of risk: High) CC: Back Pain (low); Hip Pain (left); Leg Pain (left); and Foot Pain (bilateral)  HPI  Sandra Dudley is a 66 y.o. year old, female patient, who comes today to see [place] for the first time for an initial evaluation of her chronic pain. She  does not have a problem list on file. Today she comes in for evaluation of her Back Pain (low); Hip Pain (left); Leg Pain (left); and Foot Pain (bilateral)  Pain Assessment: Location: Lower Back Radiating: left hip and leg and feet Onset: More than a month ago Duration: Chronic pain Quality: Dull, Stabbing Severity: 6 /10 (self-reported pain score)  Note: Reported level is compatible with observation.                   Effect on ADL:   Timing:   Modifying factors: Gabapentin, ice and heat, rest  Onset and Duration: {Hx; Onset and Duration:210120511} Cause of pain: {Hx; Cause:210120521} Severity: {Pain Severity:210120502} Timing: {Symptoms; Timing:210120501} Aggravating Factors: {Causes; Aggravating pain factors:210120507} Alleviating Factors: {Causes; Alleviating Factors:210120500} Associated Problems: {Hx; Associated problems:210120515} Quality of Pain: {Hx; Symptom quality or Descriptor:210120531} Previous Examinations or Tests: {Hx; Previous examinations or test:210120529} Previous Treatments: {Hx; Previous Treatment:210120503}  The patient comes into the clinics  today for the first time for a chronic pain management evaluation. ***  Today I took the time to provide the patient with information regarding my pain practice. The patient was informed that my practice is divided into two sections: an interventional pain management section, as well as a completely separate and distinct medication management section. I explained that I have procedure days for my interventional therapies, and evaluation days for follow-ups and medication management. Because of the amount of documentation required during both, they are kept separated. This means that there is the possibility that she may be scheduled for a procedure on one day, and medication management the next. I have also informed her that because of staffing and facility limitations, I no longer take patients for medication management only. To illustrate the reasons for this, I gave the patient the example of surgeons, and how inappropriate it would be to refer a patient to his/her care, just to write for the post-surgical antibiotics on a surgery done by a different surgeon.   Because interventional pain management is my board-certified specialty, the patient was informed that joining my practice means that they are open to any and all interventional therapies. I made it clear that this does not mean that they will be forced to have any procedures done. What this means is that I believe interventional therapies to be essential part of the diagnosis and proper management of chronic pain conditions. Therefore, patients not interested in these interventional alternatives will be better served under the care of a different practitioner.  The patient was also made aware of my Comprehensive Pain Management Safety Guidelines where by joining my practice, they limit all of their nerve blocks and joint  injections to those done by our practice, for as long as we are retained to manage their care.   Historic Controlled Substance  Pharmacotherapy Review  PMP and historical list of controlled substances: ***  Highest opioid analgesic regimen found: ***  Most recent opioid analgesic: ***  Current opioid analgesics: ***  Highest recorded MME/day: *** mg/day MME/day: *** mg/day Medications: The patient did not bring the medication(s) to the appointment, as requested in our "New Patient Package" Pharmacodynamics: Desired effects: Analgesia: The patient reports >50% benefit. Reported improvement in function: The patient reports medication allows her to accomplish basic ADLs. Clinically meaningful improvement in function (CMIF): Sustained CMIF goals met Perceived effectiveness: Described as relatively effective, allowing for increase in activities of daily living (ADL) Undesirable effects: Side-effects or Adverse reactions: None reported Historical Monitoring: The patient  reports that she does not use drugs. List of all UDS Test(s): No results found for: MDMA, COCAINSCRNUR, PCPSCRNUR, PCPQUANT, CANNABQUANT, THCU, [place] List of all Serum Drug Screening Test(s):  No results found for: AMPHSCRSER, BARBSCRSER, BENZOSCRSER, COCAINSCRSER, PCPSCRSER, PCPQUANT, THCSCRSER, CANNABQUANT, OPIATESCRSER, OXYSCRSER, PROPOXSCRSER Historical Background Evaluation: [place] PDMP: Six (6) year initial data search conducted.             [place] Department of public safety, offender search: Editor, commissioning Information) Non-contributory Risk Assessment Profile: Aberrant behavior: None observed or detected today Risk factors for fatal opioid overdose: None identified today Fatal overdose hazard ratio (HR): Calculation deferred Non-fatal overdose hazard ratio (HR): Calculation deferred Risk of opioid abuse or dependence: 0.7-3.0% with doses ? 36 MME/day and 6.1-26% with doses ? 120 MME/day. Substance use disorder (SUD) risk level: Pending results of Medical Psychology Evaluation for SUD Opioid risk tool (ORT) (Total Score): 3     Opioid Risk Tool - 09/29/16  1115      Family History of Substance Abuse   Alcohol Negative   Illegal Drugs Negative   Rx Drugs Negative     Personal History of Substance Abuse   Alcohol Negative   Illegal Drugs Negative   Rx Drugs Negative     Age   Age between 7-45 years  No     History of Preadolescent Sexual Abuse   History of Preadolescent Sexual Abuse Negative or Female     Psychological Disease   Psychological Disease Positive   Depression Positive  and anxiety     Total Score   Opioid Risk Tool Scoring 3   Opioid Risk Interpretation Low Risk     ORT Scoring interpretation table:  Score <3 = Low Risk for SUD  Score between 4-7 = Moderate Risk for SUD  Score >8 = High Risk for Opioid Abuse   PHQ-2 Depression Scale:  Total score: 0  PHQ-2 Scoring interpretation table: (Score and probability of major depressive disorder)  Score 0 = No depression  Score 1 = 15.4% Probability  Score 2 = 21.1% Probability  Score 3 = 38.4% Probability  Score 4 = 45.5% Probability  Score 5 = 56.4% Probability  Score 6 = 78.6% Probability   PHQ-9 Depression Scale:  Total score: 0  PHQ-9 Scoring interpretation table:  Score 0-4 = No depression  Score 5-9 = Mild depression  Score 10-14 = Moderate depression  Score 15-19 = Moderately severe depression  Score 20-27 = Severe depression (2.4 times higher risk of SUD and 2.89 times higher risk of overuse)   Pharmacologic Plan: Pending ordered tests and/or consults            Initial impression: Pending  review of available data and ordered tests.  Meds   Current Outpatient Prescriptions:  .  albuterol (PROVENTIL HFA;VENTOLIN HFA) 108 (90 Base) MCG/ACT inhaler, Inhale 1 puff into the lungs every 6 (six) hours as needed for wheezing or shortness of breath., Disp: , Rfl:  .  ALPRAZolam (XANAX) 0.25 MG tablet, Take 0.25 mg by mouth at bedtime as needed for anxiety., Disp: , Rfl:  .  fenofibrate (TRICOR) 145 MG tablet, Take 145 mg by mouth daily., Disp: , Rfl:  .   gabapentin (NEURONTIN) 100 MG capsule, Take 100 mg by mouth 2 (two) times daily., Disp: , Rfl:  .  lisinopril-hydrochlorothiazide (PRINZIDE,ZESTORETIC) 20-12.5 MG tablet, Take 1 tablet by mouth daily., Disp: , Rfl:  .  metaxalone (SKELAXIN) 800 MG tablet, Take 1 tablet (800 mg total) by mouth 3 (three) times daily., Disp: 21 tablet, Rfl: 0 .  omeprazole (PRILOSEC) 40 MG capsule, Take 40 mg by mouth daily., Disp: , Rfl:  .  traMADol (ULTRAM) 50 MG tablet, Take by mouth every 6 (six) hours as needed., Disp: , Rfl:  .  venlafaxine (EFFEXOR) 75 MG tablet, Take 75 mg by mouth 2 (two) times daily., Disp: , Rfl:  .  budesonide-formoterol (SYMBICORT) 80-4.5 MCG/ACT inhaler, Inhale 2 puffs into the lungs 2 (two) times daily., Disp: , Rfl:  .  Camphor-Menthol-Methyl Sal (HM SALONPAS PAIN RELIEF) 1.2-5.7-6.3 % PTCH, Apply 1 patch topically 3 (three) times daily. Leave in place for up to 12 hours. (Patient not taking: Reported on 09/29/2016), Disp: 30 patch, Rfl: 0 .  cyclobenzaprine (FLEXERIL) 5 MG tablet, Take 1 tablet (5 mg total) by mouth at bedtime. (Patient not taking: Reported on 09/29/2016), Disp: 15 tablet, Rfl: 0 .  diclofenac (VOLTAREN) 50 MG EC tablet, Take 1 tablet (50 mg total) by mouth 2 (two) times daily. (Patient not taking: Reported on 09/29/2016), Disp: 30 tablet, Rfl: 0 .  fluticasone (FLONASE) 50 MCG/ACT nasal spray, Place 2 sprays into both nostrils daily. (Patient not taking: Reported on 09/29/2016), Disp: 16 g, Rfl: 0 .  ibuprofen (ADVIL,MOTRIN) 600 MG tablet, Take 1 tablet (600 mg total) by mouth every 6 (six) hours as needed. (Patient not taking: Reported on 09/29/2016), Disp: 30 tablet, Rfl: 0 .  methylPREDNISolone (MEDROL DOSEPAK) 4 MG TBPK tablet, follow package directions (Patient not taking: Reported on 09/29/2016), Disp: 21 tablet, Rfl: 0 .  primidone (MYSOLINE) 50 MG tablet, Take 50 mg by mouth 2 (two) times daily., Disp: , Rfl:   Imaging Review  Cervical Imaging: Cervical MR wo contrast:  No results found for this or any previous visit. Cervical MR wo contrast:  Results for orders placed in visit on 03/08/07  [place] W/O Cm   Narrative **** PRIOR REPORT IMPORTED FROM AN EXTERNAL SYSTEM ****   PRIOR REPORT IMPORTED FROM THE [facility] WORKFLOW SYSTEM   REASON FOR EXAM:    left arm numbness  HX DSD  COMMENTS:   PROCEDURE:     MR  - MR CERVICAL SPINE WO CONT  - Mar 08 2007  1:38PM   RESULT:     Multiplanar/multisequence imaging of the cervical spine is  obtained without the administration of gadolinium.   Evaluation of the cervical cord demonstrates no T1 nor T2 signal  abnormalities.   The craniocervical junction appears unremarkable.   At the C2-C3 level there is no evidence of thecal sac stenosis or exiting  nerve root compression or compromise. A mild central disc protrusion is  appreciated.  At the C3-C4 level a mild disc protrusion is appreciated causing near  complete effacement of the anterior CSF space. There is no significant  thecal sac stenosis or exiting nerve root compression or compromise.   At the C4-C5 level a broad-based disc bulge is appreciated causing partial  effacement of the anterior CSF space. There is mild encroachment upon the  exiting nerve root sheaths bilaterally without evidence of exiting nerve  root compression nor compromise.   At the C5-C6 level a broad-based disc bulge is appreciated causing near  complete effacement of the anterior CSF space and moderate thecal sac  narrowing. There is lateralization of the broad-based disc bulge which  causes mass-effect upon the exiting nerve root sheaths and bilateral  exiting  nerve root compromise as well as compression on the RIGHT is a diagnostic  consideration.   At the C6-C7 level a broad-based disc bulge is appreciated. This causes  partial effacement of the anterior CSF space and demonstrates  lateralization  to the RIGHT and LEFT. Encroachment upon the exiting nerve root  sheath on  the RIGHT is appreciated and to a lesser extent on the LEFT. Exiting nerve  root compromise as well as possible compression on the RIGHT is a  diagnostic  consideration.   At the C7-T1 level there is no evidence of thecal sac stenosis or exiting  nerve root compression or compromise.   IMPRESSION:   1)Multilevel degenerative disc disease which appears to be most severe at  the C5-C6 level with moderate thecal sac stenosis and bilateral exiting  nerve root compromise as well as possible compression on the RIGHT. Mild  thecal sac narrowing is appreciated at the C6-C7 level with bilateral  exiting nerve root compromise and possible compression on the RIGHT at  this  level too. Bilateral exiting nerve root compromise at the C4-C5 level  secondary to a broad-based disc bulge is appreciated.   2)Surgical consultation is recommended if not already obtained.   Thank you for the opportunity to contribute to the care of your patient.       Cervical MR w/wo contrast: No results found for this or any previous visit. Cervical MR w contrast: No results found for this or any previous visit. Cervical CT wo contrast: No results found for this or any previous visit. Cervical CT w/wo contrast: No results found for this or any previous visit. Cervical CT w/wo contrast: No results found for this or any previous visit. Cervical CT w contrast: No results found for this or any previous visit. Cervical CT outside: No results found for this or any previous visit. Cervical DG 1 view: No results found for this or any previous visit. Cervical DG 2-3 views: No results found for this or any previous visit. Cervical DG F/E views: No results found for this or any previous visit. Cervical DG 2-3 clearing views: No results found for this or any previous visit. Cervical DG Bending/F/E views: No results found for this or any previous visit. Cervical DG complete: No results found for this or any previous  visit. Cervical DG Myelogram views: No results found for this or any previous visit. Cervical DG Myelogram views: No results found for this or any previous visit. Cervical Discogram views: No results found for this or any previous visit.  Shoulder Imaging: Shoulder-R MR w contrast: No results found for this or any previous visit. Shoulder-L MR w contrast: No results found for this or any previous visit. Shoulder-R MR w/wo contrast: No results  found for this or any previous visit. Shoulder-L MR w/wo contrast: No results found for this or any previous visit. Shoulder-R MR wo contrast:  Results for orders placed in visit on 04/19/05  MR Shoulder Right Wo Contrast   Narrative **** PRIOR REPORT IMPORTED FROM AN EXTERNAL SYSTEM ****   PRIOR REPORT IMPORTED FROM THE [facility] WORKFLOW SYSTEM   REASON FOR EXAM:  RT shoulder pain, evaluate rotator cuff  COMMENTS:   PROCEDURE:     MR  - MR SHOULDER RT  WO CONTRAST  - Apr 19 2005  5:49PM   RESULT:   HISTORY:  RIGHT shoulder pain.   COMPARISON STUDIES:  None.   PROCEDURE AND FINDINGS:  Multiplanar/multisequence imaging of the RIGHT  shoulder was obtained.  There is a long oblique approximately 1.5 cm tear  of  the distal supraspinatus tendon approximately beginning approximately 1 cm  to 2 cm proximal to its distal insertion.  There is no tendinous  retraction.   There is a partial articular surface tear of the infraspinatus tendon.  A  high-grade partial tear of the distal subscapularis tendon is present over  an approximately 2 cm segment of the distal tendon.  Bicipital tendon is  intact and in place.  Humeral head is intact.  Glenoid labrum is intact.   IMPRESSION:   Complete tear of the distal supraspinatus tendon, no tendinous retraction.  The tear occurs over approximately 1.5 cm and is oblique beginning  approximately 1 to 2 cm proximal to the distal tendinous insertion.   Subtle articular surface partial tear of the distal  infraspinatus tendon.   High-grade partial tear over the distal subscapularis tendon coursing over  approximately 1 to 2 cm.   Thank you for this opportunity to contribute to the care of your patient.       Shoulder-L MR wo contrast:  Results for orders placed in visit on 11/22/13  MR Shoulder Left Wo Contrast   Narrative **** PRIOR REPORT IMPORTED FROM AN EXTERNAL SYSTEM ****   CLINICAL DATA:  Left shoulder pain and limited range of motion since  a fall last month while walking the dog.   EXAM:  MRI OF THE LEFT SHOULDER WITHOUT CONTRAST   TECHNIQUE:  Multiplanar, multisequence MR imaging of the shoulder was performed.  No intravenous contrast was administered.   COMPARISON:  Radiographs dated 10/02/2013   FINDINGS:  Rotator cuff: There is a 2 cm diameter full-thickness retracted tear  of the distal supraspinatus tendon as well as a wide full-thickness  retracted tear of the subscapularis tendon. There is degenerative  tendinosis of the articular surface of the distal infraspinatus.  Teres minor is intact.   Muscles:  There is slight atrophy of the supraspinatus muscle.   Biceps long head: Long head of the biceps tendon is torn adjacent to  its origin and distally retracted. The stump of the tendon origin is  flipped above the degenerated superior labrum.   Acromioclavicular Joint: Severe arthritic changes of the  acromioclavicular joint. Hypertrophy of the distal clavicle could  predispose to impingement.   Glenohumeral Joint: Moderate glenohumeral joint effusion.   Labrum: The anterior labrum is not well seen. Degeneration of the  superior labrum.   Bones:  Extra-articular bones are normal.   IMPRESSION:  1. Full-thickness retracted tears of the supraspinatus and  subscapularis tendons.  2. Complete rupture of the origin of the long head of the biceps  tendon with distal retraction.  3. Degenerative tendinosis of the articular surface  of the distal   infraspinatus tendon.  4. Prominent AC joint arthropathy.    Electronically Signed    By: Rozetta Nunnery M.D.    On: 11/22/2013 16:43       Shoulder-R CT w contrast: No results found for this or any previous visit. Shoulder-L CT w contrast: No results found for this or any previous visit. Shoulder-R CT w/wo contrast: No results found for this or any previous visit. Shoulder-L CT w/wo contrast: No results found for this or any previous visit. Shoulder-R CT wo contrast: No results found for this or any previous visit. Shoulder-L CT wo contrast: No results found for this or any previous visit. Shoulder-R DG Arthrogram: No results found for this or any previous visit. Shoulder-L DG Arthrogram: No results found for this or any previous visit. Shoulder-R DG 1 view: No results found for this or any previous visit. Shoulder-L DG 1 view: No results found for this or any previous visit. Shoulder-R DG: No results found for this or any previous visit. Shoulder-L DG:  Results for orders placed in visit on 10/02/13  DG Shoulder Left   Narrative **** PRIOR REPORT IMPORTED FROM AN EXTERNAL SYSTEM ****   CLINICAL DATA:  Left shoulder pain   EXAM:  DG SHOULDER 3+VIEWS LEFT   COMPARISON:  None.   FINDINGS:  No fracture or dislocation. Mild osteoarthritis of the left  glenohumeral joint. Ossific fragment adjacent to the inferior margin  of the glenoid likely representing a small loose body. Mild  degenerative changes of the acromioclavicular joint. No lytic or  sclerotic osseous lesion. Normal soft tissues.   IMPRESSION:  No acute osseous injury of the left shoulder.    Electronically Signed    By: Kathreen Devoid    On: 10/02/2013 17:25        Thoracic Imaging: Thoracic MR wo contrast: No results found for this or any previous visit. Thoracic MR wo contrast:  Results for orders placed in visit on 06/26/04  MR [place] W/O Cm   Narrative **** PRIOR REPORT IMPORTED FROM AN EXTERNAL  SYSTEM ****   PRIOR REPORT IMPORTED FROM THE [facility] WORKFLOW SYSTEM   REASON FOR EXAM:  Mid and low back pain  COMMENTS:   PROCEDURE:     MR  - MR THORACIC SPINE WO  - Jun 26 2004  1:20PM   RESULT:     Multiplanar/multisequence imaging of the thoracic spine is  obtained.   Multiple signal abnormalities are noted within the thecal sac. The signal  abnormalities appear to be intradural/extramedullary. These may just  represent pulsation artifacts; however, to further evaluate this patient  and  to exclude the presence of an intrathecal/extramedullary process, MRI of  thoracic spine with Gadolinium enhancement is suggested. No other focal  abnormalities are identified. There is no evidence of thoracic disc  protrusion.   IMPRESSION:   1.     No evidence of thoracic disc protrusion.  2.     Intrathecal signal abnormality is noted. These may represent  pulsation artifacts; however, intrathecal/extramedullary lesions in the  thoracic canal cannot be excluded. For further evaluation, thoracic MRI  with  Gadolinium enhancement is suggested.       Thoracic MR w/wo contrast:  Results for orders placed in visit on 08/31/04  MR Thoracic Spine W Wo Contrast   Narrative **** PRIOR REPORT IMPORTED FROM AN EXTERNAL SYSTEM ****   PRIOR REPORT IMPORTED FROM THE [facility] WORKFLOW SYSTEM   REASON FOR EXAM:  Mid and Low Back Pain and Lower Extr Pain  COMMENTS:   PROCEDURE:     MR  - MR THORACIC SPINE WO/W  - Aug 31 2004  8:17AM   RESULT:     Multiplanar/multisequence imaging of the thoracic spine is  obtained with Gadolinium enhancement. No evidence of disc protrusion, no  evidence of enhancement.  The paraspinal regions are normal. No bony  lesions  are noted.   IMPRESSION:   1)No significant abnormalities identified. There is no evidence of disc  protrusion.   2)The previously identified intrathecal signal abnormality most likely  represented pulsation artifact. No enhancing lesions  are identified.       Thoracic MR w contrast: No results found for this or any previous visit. Thoracic CT wo contrast: No results found for this or any previous visit. Thoracic CT w/wo contrast: No results found for this or any previous visit. Thoracic CT w/wo contrast: No results found for this or any previous visit. Thoracic CT w contrast: No results found for this or any previous visit. Thoracic DG 2-3 views: No results found for this or any previous visit. Thoracic DG 4 views: No results found for this or any previous visit. Thoracic DG: No results found for this or any previous visit. Thoracic DG w/swimmers view: No results found for this or any previous visit. Thoracic DG Myelogram views: No results found for this or any previous visit. Thoracic DG Myelogram views: No results found for this or any previous visit.  Lumbosacral Imaging: Lumbar MR wo contrast:  Results for orders placed during the hospital encounter of 07/01/16  MR LUMBAR SPINE WO CONTRAST   Narrative CLINICAL DATA:  Constant back pain with recurring numbness in the left hip, leg and foot. Difficulty walking. Previous fusion surgery 2012.  EXAM: MRI LUMBAR SPINE WITHOUT CONTRAST  TECHNIQUE: Multiplanar, multisequence MR imaging of the lumbar spine was performed. No intravenous contrast was administered.  COMPARISON:  CT abdomen 01/29/2016.  Lumbar MRI 01/23/2014.  FINDINGS: Segmentation:  5 lumbar type vertebral bodies  Alignment:  Mild curvature convex to the left with the apex at L3.  Vertebrae: Benign appearing hemangioma on the right at T12. Discogenic marrow changes L2 to sacrum.  Conus medullaris: Extends to the L1 level and appears normal.  Paraspinal and other soft tissues: Negative  Disc levels:  T12-L1:  Normal.  L1-2: Mild bulging of the disc. Mild facet osteoarthritis. No stenosis.  L2-3: Shallow protrusion of the disc. Mild facet and ligamentous hypertrophy. Mild indentation of  the thecal sac but no apparent compressive stenosis. Similar appearance to the previous study.  L3-4: Previous interspinous device. Previous lateral discectomy with interbody spacer. No disc bulge or herniation. No compressive stenosis. Similar appearance to the previous study.  L4-5: Previous interspinous device. Previous lateral discectomy with interbody spacer. Endplate osteophytes and bulging of disc material more prominent towards the left. Mild facet hypertrophy. Narrowing of the left lateral recess and intervertebral foramen on the left that could possibly be symptomatic. Similar appearance to the previous study.  L5-S1: Disc degeneration with bulging and left posterolateral to foraminal to extraforaminal disc protrusion. Mild facet hypertrophy. No compressive stenosis in the canal. Foraminal to extraforaminal encroachment on the left that could continued to compress the left L5 nerve.  IMPRESSION: No appreciable change since the study of 01/23/2014.  L2-3: Shallow disc protrusion. Mild facet and ligamentous hypertrophy. No compressive stenosis.  L3-4: Interspinous device. Facet hypertrophy. No compressive stenosis.  L4-5: Interspinous device. Facet hypertrophy.  Endplate osteophytes and bulging disc material more prominent towards the left. Left lateral recess and foraminal narrowing that could be symptomatic. Similar appearance to the previous study.  L5-S1: Bulging disc. Focal protrusion in the left foraminal to extraforaminal region with some potential to compress the left L5 nerve. Similar appearance to the previous study.   Electronically Signed   By: Nelson Chimes M.D.   On: 07/01/2016 15:00    Lumbar MR wo contrast:  Results for orders placed in visit on 04/07/15  [facility]   Narrative **** PRIOR REPORT IMPORTED FROM AN EXTERNAL SYSTEM ****   PRIOR REPORT IMPORTED FROM THE [facility] WORKFLOW SYSTEM   REASON FOR EXAM:  mid and low back pain      low extremity pain and  numbness  COMMENTS:   PROCEDURE:     MR  - MR LUMBAR SPINE WO CONTRAST  - Jun 26 2004  1:20PM   RESULT:     Multiplanar/multisequence imaging of the lumbar spine is  obtained. There is straightening of the lumbar spine.  The lumbar  vertebrae  are numbered with the lowest segmented vertebra as L5.  Multilevel disc  degeneration is present particularly at L3-L4 and L4-L5.  Prominent  annular  bulges along with facetal hypertrophy are noted at these levels with  prominent areas of thecal sac narrowing at these levels.  The thecal sac  narrows to approximately 5 mm at these levels. No disc protrusions are  identified. No paraspinal lesions are identified.   IMPRESSION:   1)Lumbar vertebrae numbered with the lowest segmented vertebral shaped  body  being labeled L5.  These are labeled on the film in red wax crayon.   2)L3-L4 and L4-L5 prominent annular bulges along with prominent facetal  hypertrophy resulting in prominent narrowing of the thecal sac at these  levels.       Lumbar MR w/wo contrast: No results found for this or any previous visit. Lumbar MR w contrast: No results found for this or any previous visit. Lumbar CT wo contrast: No results found for this or any previous visit. Lumbar CT w/wo contrast: No results found for this or any previous visit. Lumbar CT w/wo contrast: No results found for this or any previous visit. Lumbar CT w contrast: No results found for this or any previous visit. Lumbar DG 1V: No results found for this or any previous visit. Lumbar DG 1V (Clearing): No results found for this or any previous visit. Lumbar DG 2-3V (Clearing): No results found for this or any previous visit. Lumbar DG 2-3 views:  Results for orders placed during the hospital encounter of 12/02/08  DG Lumbar Spine 2-3 Views   Narrative Clinical Data: Low back pain   LUMBAR SPINE - 2-3 VIEW   Comparison: 08/26/2008   Findings: Stable appearance of  the posterior fixation at L3-4 and L4-5.  Interbody fusion material stable in appearance and position. L3-4 and L4-5  x-stop devices stable.  Vertebral body sclerosis is appreciated at L3-4 and L4-5.   IMPRESSION: Stable postoperative changes.  Provider: Dario Guardian   Lumbar DG (Complete) 4+V:  Results for orders placed during the hospital encounter of 01/01/08  DG Lumbar Spine Complete   Narrative Clinical Data: Low back and bilateral leg pain   LUMBAR SPINE - COMPLETE 4+ VIEW   Comparison: None   Findings: There is marked disc space narrowing at L3-4 and L4-5. There is endplate sclerosis and osteophytic formation.  There is  retrolisthesis of L4 on L5 by approximately 3 mm. Slight left lateral listhesis of L4 in relation to L3 and L5 bodies.  There is anterolisthesis of L3 on L4 by approximately 5 mm.  No pars defects.  Decreased range of motion with flexion and extension.   IMPRESSION: Advanced DDD at L3-4 and L4-5.  Mild spondylolisthesis at L3-4 and minimal retrolisthesis of L4 on L5.  Provider: Felipe Drone   Lumbar DG F/E views: No results found for this or any previous visit. Lumbar DG Bending views: No results found for this or any previous visit. Lumbar DG Myelogram views: No results found for this or any previous visit. Lumbar DG Myelogram: No results found for this or any previous visit. Lumbar DG Myelogram: No results found for this or any previous visit. Lumbar DG Myelogram: No results found for this or any previous visit. Lumbar DG Myelogram Lumbosacral: No results found for this or any previous visit. Lumbar DG Diskogram views: No results found for this or any previous visit. Lumbar DG Diskogram views: No results found for this or any previous visit. Lumbar DG Epidurogram OP: No results found for this or any previous visit. Lumbar DG Epidurogram IP: No results found for this or any previous visit.  Sacroiliac Joint Imaging: Sacroiliac Joint DG: No results  found for this or any previous visit. Sacroiliac Joint MR w/wo contrast: No results found for this or any previous visit. Sacroiliac Joint MR wo contrast: No results found for this or any previous visit.  Spine Imaging: Whole Spine DG Myelogram views: No results found for this or any previous visit. Whole Spine MR Mets screen: No results found for this or any previous visit. Whole Spine MR Mets screen: No results found for this or any previous visit. Whole Spine MR w/wo: No results found for this or any previous visit. MRA Spinal Canal w/ cm: No results found for this or any previous visit. MRA Spinal Canal wo/ cm: No results found for this or any previous visit. MRA Spinal Canal w/wo cm: No results found for this or any previous visit. Spine Outside MR Films: No results found for this or any previous visit. Spine Outside CT Films: No results found for this or any previous visit. CT-Guided Biopsy: No results found for this or any previous visit. CT-Guided Needle Placement: No results found for this or any previous visit. DG Spine outside: No results found for this or any previous visit. IR Spine outside: No results found for this or any previous visit. NM Spine outside: No results found for this or any previous visit.  Hip Imaging: Hip-R MR w contrast: No results found for this or any previous visit. Hip-L MR w contrast: No results found for this or any previous visit. Hip-R MR w/wo contrast: No results found for this or any previous visit. Hip-L MR w/wo contrast: No results found for this or any previous visit. Hip-R MR wo contrast: No results found for this or any previous visit. Hip-L MR wo contrast: No results found for this or any previous visit. Hip-R CT w contrast: No results found for this or any previous visit. Hip-L CT w contrast: No results found for this or any previous visit. Hip-R CT w/wo contrast: No results found for this or any previous visit. Hip-L CT w/wo contrast: No  results found for this or any previous visit. Hip-R CT wo contrast: No results found for this or any previous visit. Hip-L CT wo contrast: No results found for this  or any previous visit. Hip-R DG 2-3 views: No results found for this or any previous visit. Hip-L DG 2-3 views:  Results for orders placed during the hospital encounter of 07/08/14  DG HIP UNILAT WITH PELVIS 2-3 VIEWS LEFT   Narrative CLINICAL DATA:  Posterior left hip pain beginning the night of 07/07/2014 after the patient heard a pop while walking upstairs. Initial encounter.  EXAM: LEFT HIP (WITH PELVIS) 2-3 VIEWS  COMPARISON:  None.  FINDINGS: No acute bony or joint abnormality is identified. No evidence of avascular necrosis of the femoral heads is seen. There is no notable degenerative change about the hips, symphysis pubis or sacroiliac joints. The patient is status post lower lumbar fusion.  IMPRESSION: No acute abnormality or finding to explain the patient's symptoms.   Electronically Signed   By: Inge Rise M.D.   On: 07/09/2014 08:28    Hip-R DG Arthrogram: No results found for this or any previous visit. Hip-L DG Arthrogram: No results found for this or any previous visit. Hip-B DG Bilateral: No results found for this or any previous visit.  Knee Imaging: Knee-R MR w contrast: No results found for this or any previous visit. Knee-L MR w contrast:  Results for orders placed in visit on 11/18/05  MR Knee Left  Wo Contrast   Narrative **** PRIOR REPORT IMPORTED FROM AN EXTERNAL SYSTEM ****   PRIOR REPORT IMPORTED FROM THE [facility] WORKFLOW SYSTEM   REASON FOR EXAM:   Bilateral knee pain eval for tear  COMMENTS:   PROCEDURE:     MR  - MR KNEE LT  WO CONTRAST  - Nov 18 2005 10:50AM   RESULT:   HISTORY:  Bilateral knee pain.   COMPARISON STUDIES:   RIGHT knee series dated 12/24/04.   PROCEDURE AND FINDINGS:  Multiplanar/multisequence imaging of the RIGHT  knee  is obtained.  The  quadriceps and patellar tendons are intact.  Prepatellar  edema is noted.  Anterior cruciate ligament is torn.  The posterior  cruciate  is intact.  The posterior horn of the medial meniscus is not well  identified  and is most likely maceration.  The anterior horn of the medial meniscus  is  intact.  The lateral meniscus is intact.   Collateral ligaments are  intact.  There is edema in the medial tibial plateau.  A bony density is noted in  the  medial popliteal space medial to the medial head of the gastrocnemius.   This  could represent a loose body.  A donor site is not definitely identified.  Severe subchondral change is present about the medial tibial plateau and  medial femoral condyle.   IMPRESSION:   1.     Macerated tear of the posterior horn of the medial meniscus.  2.     Anterior cruciate ligamentous tear.  There is anterior subluxation  of  the tibia.  3.     Loose body noted along the medial popliteal space medial to the  medial head of the gastrocnemius.  4.     Subchondral degenerative change about the medial femoral plateau  and  femoral condyle.   Thank you for the opportunity to contribute to the care of your patient.       Knee-R MR w/wo contrast: No results found for this or any previous visit. Knee-L MR w/wo contrast: No results found for this or any previous visit. Knee-R MR wo contrast:  Results for orders placed in visit on 04/10/15  MR Knee Right Wo Contrast   Narrative **** PRIOR REPORT IMPORTED FROM AN EXTERNAL SYSTEM ****   PRIOR REPORT IMPORTED FROM THE [facility] WORKFLOW SYSTEM   REASON FOR EXAM:    Bilateral knee pain eval for tear  COMMENTS:   PROCEDURE:     MR  - MR KNEE RT  WO  CONTRAST  - Nov 18 2005 10:50AM   RESULT:   HISTORY:  Knee pain.   PROCEDURE AND FINDINGS:   Multiplanar/multisequence imaging of the RIGHT  knee is obtained.  There is a severe anterior cruciate ligamentous tear.  The posterior cruciate ligament is intact.  Complex tear of the posterior  horn of the medial meniscus is noted.  The lateral meniscus is intact.   The  collateral ligaments are intact.  There is a large knee joint effusion  with  suprapatellar plicae. Subchondral degenerative changes are present over  the  medial femoral condyle.  An osteochondral defect is noted involving the  medial femoral condyle.   IMPRESSION:   1.     Complete anterior cruciate ligamentous tear.  2.     Large knee joint effusion with suprapatellar plicae.  3.     Complex tear of the posterior horn of the medial meniscus.  4.     Extensive subchondral degenerative change about the medial femoral  condyle and to a lesser extent the medial tibial plateau.   Thank you for the opportunity to contribute to the care of your patient.       Knee-L MR wo contrast: No results found for this or any previous visit. Knee-R CT w contrast: No results found for this or any previous visit. Knee-L CT w contrast: No results found for this or any previous visit. Knee-R CT w/wo contrast: No results found for this or any previous visit. Knee-L CT w/wo contrast: No results found for this or any previous visit. Knee-R CT wo contrast: No results found for this or any previous visit. Knee-L CT wo contrast: No results found for this or any previous visit. Knee-R DG 1-2 views:  Results for orders placed in visit on 02/10/06  DG Knee 1-2 Views Right   Narrative **** PRIOR REPORT IMPORTED FROM AN EXTERNAL SYSTEM ****   PRIOR REPORT IMPORTED FROM THE [facility] WORKFLOW SYSTEM   REASON FOR EXAM:    postop  COMMENTS:  Bedside (portable):Y   PROCEDURE:     DXR - DXR KNEE RIGHT AP AND LATERAL  - Feb 10 2006  1:56PM   RESULT:     AP and lateral views of the RIGHT knee show the patient to be  status post RIGHT knee replacement.  No fracture about the femoral or  tibial  prosthetic components is seen.  There is no dislocation of the prosthetic  knee joint.  Surgical drains are noted.    IMPRESSION:   The patient is status post RIGHT knee replacement.  No abnormal  postoperative changes are identified.   Thank you for this opportunity to contribute to the care of your patient.       Knee-L DG 1-2 views:  Results for orders placed in visit on 07/17/12  DG Knee 1-2 Views Left   Narrative **** PRIOR REPORT IMPORTED FROM AN EXTERNAL SYSTEM ****   PRIOR REPORT IMPORTED FROM THE [facility] WORKFLOW SYSTEM   REASON FOR EXAM:    post op  COMMENTS:   Bedside (portable):Y   PROCEDURE:     DXR - DXR KNEE LEFT AP AND  LATERAL  - Jul 17 2012  3:47PM   RESULT:     Left knee images demonstrate the patient status post left knee  arthroplasty. There is no immediate postoperative bone or hardware  complication. Alignment is maintained.   IMPRESSION:      Please see above.   Dictation Site: 2       Knee-R DG 3 views: No results found for this or any previous visit. Knee-L DG 3 views: No results found for this or any previous visit. Knee-R DG 4 views:  Results for orders placed in visit on 12/24/04  DG Knee Complete 4 Views Right   Narrative **** PRIOR REPORT IMPORTED FROM AN EXTERNAL SYSTEM ****   PRIOR REPORT IMPORTED FROM THE [facility] WORKFLOW SYSTEM   REASON FOR EXAM:  Pain  COMMENTS:   PROCEDURE:     DXR - DXR KNEE RT COMP WITH OBLIQUES  - Dec 24 2004 11:00AM   RESULT:     Multiple views reveal a small joint effusion present. No acute  fractures or dislocations are identified. If one is concerned for internal  derangement, knee MRI could be performed.   IMPRESSION:     Small, RIGHT joint effusion. No additional abnormalities  are  seen.   Thank you for this opportunity to contribute to the care of your patient.       Knee-L DG 4 views: No results found for this or any previous visit. Knee-R DG Arthrogram: No results found for this or any previous visit. Knee-L DG Arthrogram: No results found for this or any previous visit.  Complexity Note: Imaging results  reviewed. Results discussed using Layman's terms.               ROS  Cardiovascular History: {Hx; Cardiovascular History:210120525} Pulmonary or Respiratory History: {Hx; Pumonary and/or Respiratory History:210120523} Neurological History: {Hx; Neurological:210120504} Review of Past Neurological Studies:  Results for orders placed or performed during the hospital encounter of 06/29/15  CT Head Wo Contrast   Narrative   CLINICAL DATA:  New onset tremors  EXAM: CT HEAD WITHOUT CONTRAST  TECHNIQUE: Contiguous axial images were obtained from the base of the skull through the vertex without intravenous contrast.  COMPARISON:  Jun 24, 2007  FINDINGS: Paranasal sinuses, mastoid air cells, and bones are normal. Extracranial soft tissues are unremarkable. Significant streak artifact limits evaluation. Within this limitation, no subdural, epidural, or subarachnoid hemorrhage identified. No mass, mass effect, or midline shift. Ventricles and sulci are normal and unchanged. The cerebellum, brainstem, and basal cisterns are normal. No acute cortical ischemia or infarct.  IMPRESSION: No abnormalities identified. The study is mildly limited due to streak artifact.   Electronically Signed   By: Dorise Bullion III M.D   On: 06/29/2015 18:54    Psychological-Psychiatric History: {Hx; Psychological-Psychiatric History:210120512} Gastrointestinal History: {Hx; Gastrointestinal:210120527} Genitourinary History: {Hx; Genitourinary:210120506} Hematological History: {Hx; Hematological:210120510} Endocrine History: {Hx; Endocrine history:210120509} Rheumatologic History: {Hx; Rheumatological:210120530} Musculoskeletal History: {Hx; Musculoskeletal:210120528} Work History: {Hx; Work history:210120514}  Allergies  Sandra Dudley is allergic to codeine and penicillins.  Laboratory Chemistry  Inflammation Markers (CRP: Acute Phase) (ESR: Chronic Phase) Lab Results  Component Value Date    ESRSEDRATE 8 07/04/2012                 Renal Function Markers Lab Results  Component Value Date   BUN 29 (H) 06/29/2015   CREATININE 1.25 (H) 06/29/2015   GFRAA 51 (L) 06/29/2015   GFRNONAA 44 (L) 06/29/2015  Hepatic Function Markers Lab Results  Component Value Date   AST 83 (H) 06/29/2015   ALT 71 (H) 06/29/2015   ALBUMIN 4.1 06/29/2015   ALKPHOS 51 06/29/2015                 Electrolytes Lab Results  Component Value Date   NA 140 06/29/2015   K 4.2 06/29/2015   CL 110 06/29/2015   CALCIUM 9.1 06/29/2015                 Neuropathy Markers No results found for: [id number]               Bone Pathology Markers Lab Results  Component Value Date   ALKPHOS 51 06/29/2015   CALCIUM 9.1 06/29/2015                 Coagulation Parameters Lab Results  Component Value Date   INR 0.9 07/04/2012   LABPROT 12.8 07/04/2012   APTT 30.0 07/04/2012   PLT 189 06/29/2015                 Cardiovascular Markers Lab Results  Component Value Date   BNP 22.0 06/29/2015   HGB 12.2 06/29/2015   HCT 35.7 06/29/2015                 Note: Lab results reviewed.  PFSH  Drug: Sandra Dudley  reports that she does not use drugs. Alcohol:  reports that she does not drink alcohol. Tobacco:  reports that she has never smoked. She has never used smokeless tobacco. Medical:  has a past medical history of Anxiety; Arthritis; Chronic pain; Enlarged heart; High cholesterol; and Hypertension. Family: family history includes Cancer in her father; Hypertension in her mother.  Past Surgical History:  Procedure Laterality Date  . BACK SURGERY    . KNEE SURGERY    . SHOULDER SURGERY     Active Ambulatory Problems    Diagnosis Date Noted  . No Active Ambulatory Problems   Resolved Ambulatory Problems    Diagnosis Date Noted  . No Resolved Ambulatory Problems   Past Medical History:  Diagnosis Date  . Anxiety   . Arthritis   . Chronic pain   . Enlarged heart   . High  cholesterol   . Hypertension    Constitutional Exam  General appearance: Well nourished, well developed, and well hydrated. In no apparent acute distress Vitals:   09/29/16 1106  BP: (!) 140/54  Pulse: 88  Resp: 20  Temp: 98 F (36.7 C)  TempSrc: Oral  SpO2: 95%  Weight: 207 lb 3.2 oz (94 kg)  Height: '5\' 5"'  (1.651 m)   BMI Assessment: Estimated body mass index is 34.48 kg/m as calculated from the following:   Height as of this encounter: '5\' 5"'  (1.651 m).   Weight as of this encounter: 207 lb 3.2 oz (94 kg).  BMI interpretation table: BMI level Category Range association with higher incidence of chronic pain  <18 kg/m2 Underweight   18.5-24.9 kg/m2 Ideal body weight   25-29.9 kg/m2 Overweight Increased incidence by 20%  30-34.9 kg/m2 Obese (Class I) Increased incidence by 68%  35-39.9 kg/m2 Severe obesity (Class II) Increased incidence by 136%  >40 kg/m2 Extreme obesity (Class III) Increased incidence by 254%   BMI Readings from Last 4 Encounters:  09/29/16 34.48 kg/m  06/02/16 36.61 kg/m  05/28/16 36.78 kg/m  05/09/16 34.28 kg/m   Wt Readings from Last 4 Encounters:  09/29/16 207 lb 3.2  oz (94 kg)  06/02/16 220 lb (99.8 kg)  05/28/16 221 lb (100.2 kg)  05/09/16 206 lb (93.4 kg)  Psych/Mental status: Alert, oriented x 3 (person, place, & time)       Eyes: PERLA Respiratory: No evidence of acute respiratory distress  Cervical Spine Area Exam  Skin & Axial Inspection: No masses, redness, edema, swelling, or associated skin lesions Alignment: Symmetrical Functional ROM: Unrestricted ROM      Stability: No instability detected Muscle Tone/Strength: Functionally intact. No obvious neuro-muscular anomalies detected. Sensory (Neurological): Unimpaired Palpation: No palpable anomalies              Upper Extremity (UE) Exam    Side: Right upper extremity  Side: Left upper extremity  Skin & Extremity Inspection: Skin color, temperature, and hair growth are WNL. No  peripheral edema or cyanosis. No masses, redness, swelling, asymmetry, or associated skin lesions. No contractures.  Skin & Extremity Inspection: Skin color, temperature, and hair growth are WNL. No peripheral edema or cyanosis. No masses, redness, swelling, asymmetry, or associated skin lesions. No contractures.  Functional ROM: Unrestricted ROM          Functional ROM: Unrestricted ROM          Muscle Tone/Strength: Functionally intact. No obvious neuro-muscular anomalies detected.  Muscle Tone/Strength: Functionally intact. No obvious neuro-muscular anomalies detected.  Sensory (Neurological): Unimpaired          Sensory (Neurological): Unimpaired          Palpation: No palpable anomalies              Palpation: No palpable anomalies              Specialized Test(s): Deferred         Specialized Test(s): Deferred          Thoracic Spine Area Exam  Skin & Axial Inspection: No masses, redness, or swelling Alignment: Symmetrical Functional ROM: Unrestricted ROM Stability: No instability detected Muscle Tone/Strength: Functionally intact. No obvious neuro-muscular anomalies detected. Sensory (Neurological): Unimpaired Muscle strength & Tone: No palpable anomalies  Lumbar Spine Area Exam  Skin & Axial Inspection: No masses, redness, or swelling Alignment: Symmetrical Functional ROM: Unrestricted ROM      Stability: No instability detected Muscle Tone/Strength: Functionally intact. No obvious neuro-muscular anomalies detected. Sensory (Neurological): Unimpaired Palpation: No palpable anomalies       Provocative Tests: Lumbar Hyperextension and rotation test: evaluation deferred today       Lumbar Lateral bending test: evaluation deferred today       Patrick's Maneuver: evaluation deferred today                    Gait & Posture Assessment  Ambulation: Unassisted Gait: Relatively normal for age and body habitus Posture: WNL   Lower Extremity Exam    Side: Right lower extremity  Side:  Left lower extremity  Skin & Extremity Inspection: Skin color, temperature, and hair growth are WNL. No peripheral edema or cyanosis. No masses, redness, swelling, asymmetry, or associated skin lesions. No contractures.  Skin & Extremity Inspection: Skin color, temperature, and hair growth are WNL. No peripheral edema or cyanosis. No masses, redness, swelling, asymmetry, or associated skin lesions. No contractures.  Functional ROM: Unrestricted ROM          Functional ROM: Unrestricted ROM          Muscle Tone/Strength: Functionally intact. No obvious neuro-muscular anomalies detected.  Muscle Tone/Strength: Functionally intact. No obvious neuro-muscular anomalies detected.  Sensory (Neurological): Unimpaired  Sensory (Neurological): Unimpaired  Palpation: No palpable anomalies  Palpation: No palpable anomalies   Assessment  Primary Diagnosis & Pertinent Problem List: There were no encounter diagnoses.  Visit Diagnosis (New problems to examiner): No diagnosis found. Plan of Care (Initial workup plan)  Note: Please be advised that as per protocol, today's visit has been an evaluation only. We have not taken over the patient's controlled substance management.  Problem-specific plan: No problem-specific Assessment & Plan notes found for this encounter.  Ordered Lab-work, Procedure(s), Referral(s), & Consult(s): No orders of the defined types were placed in this encounter.  Pharmacotherapy (current): Medications ordered:  No orders of the defined types were placed in this encounter.  Medications administered during this visit: Sandra Dudley had no medications administered during this visit.   Pharmacological management options:  Opioid Analgesics: The patient was informed that there is no guarantee that she would be a candidate for opioid analgesics. The decision will be made following CDC guidelines. This decision will be based on the results of diagnostic studies, as well as Sandra Dudley's  risk profile.   Membrane stabilizer: To be determined at a later time  Muscle relaxant: To be determined at a later time  NSAID: To be determined at a later time  Other analgesic(s): To be determined at a later time   Interventional management options: Sandra Dudley was informed that there is no guarantee that she would be a candidate for interventional therapies. The decision will be based on the results of diagnostic studies, as well as Sandra Dudley's risk profile.  Procedure(s) under consideration:  ***   Provider-requested follow-up: No Follow-up on file.  No future appointments.  Primary Care Physician: Cletis Athens, MD Location: [facility] Note by: Gillis Santa, M.D, Date: 09/29/2016; Time: 11:52 AM  There are no Patient Instructions on file for this visit.

## 2016-09-29 NOTE — Progress Notes (Signed)
Patient's Name: Sandra Dudley  MRN: [id number]  Referring Provider: Tia Alert, MD  DOB: 1950-11-02  PCP: Corky Downs, MD  DOS: 09/29/2016  Note by: Edward Jolly, MD  Service setting: Ambulatory outpatient  Specialty: Interventional Pain Management  Location: [facility] (AMB) Pain Management Facility    Patient type: New patient ("FAST-TRACK" Evaluation)   Warning: This referral option does not include the extensive pharmacological evaluation required for [place] to take over the patient's medication management. The "Fast-Track" system is designed to bypass the new patient referral waiting list, as well as the normal patient evaluation process, in order to provide a patient in distress with a timely pain management intervention. Because the system was not designed to unfairly get a patient into our pain practice ahead of those already waiting, certain restrictions apply. By requesting a "Fast-Track" consult, the referring physician has opted to continue managing the patient's medications in order to get interventional urgent care.  Primary Reason for Visit: Interventional Pain Management Treatment. CC: Back Pain (low); Hip Pain (left); Leg Pain (left); and Foot Pain (bilateral)   Procedure  HPI  Ms. Sandra Dudley is a 66 y.o. year old, female patient, who comes today for a  "Fast-Track" new patient evaluation, as requested by Tia Alert, MD. The patient has been made aware that this type of referral option is reserved for the Interventional Pain Management portion of our practice and completely excludes the option of medication management. Her primarily concern today is the Back Pain (low); Hip Pain (left); Leg Pain (left); and Foot Pain (bilateral)  Pain Assessment: Location: Lower Back Radiating: left hip and leg and feet Onset: More than a month ago Duration: Chronic pain Quality: Dull, Stabbing Severity: 6 /10 (self-reported pain score)  Note: Reported level is compatible with observation.                    Effect on ADL:  negatively impacts activities of daily living, harder to walk around the house and complete chores Timing:  started many years ago but has gone worse Modifying factors: Gabapentin, ice and heat, rest  Onset and Duration: Sudden Cause of pain: Unknown Severity: Getting better, NAS-11 at its worse: 10/10, NAS-11 at its best: 6/10 and NAS-11 on the average: 6/10 Timing: Not influenced by the time of the day Aggravating Factors: nothing documented Alleviating Factors: Cold packs, Hot packs and Warm showers or baths Associated Problems: Constipation and Spasms Quality of Pain: Aching and Cramping Previous Examinations or Tests: MRI scan and X-rays Previous Treatments: Trigger point injections  The patient comes into the clinics today, referred to [place] for a lumbar epidural steroid injection given her chronic low back pain with spinal stenosis, history of L3/L4, L4/L5 lumbar fusion, L2/L3 and L5/S1 severe degenerative disc disease, chronic lumbosacral radiculopathy more pronounced on the left.  Meds   Current Outpatient Prescriptions:  .  albuterol (PROVENTIL HFA;VENTOLIN HFA) 108 (90 Base) MCG/ACT inhaler, Inhale 1 puff into the lungs every 6 (six) hours as needed for wheezing or shortness of breath., Disp: , Rfl:  .  ALPRAZolam (XANAX) 0.25 MG tablet, Take 0.25 mg by mouth at bedtime as needed for anxiety., Disp: , Rfl:  .  fenofibrate (TRICOR) 145 MG tablet, Take 145 mg by mouth daily., Disp: , Rfl:  .  gabapentin (NEURONTIN) 100 MG capsule, Take 100 mg by mouth 2 (two) times daily., Disp: , Rfl:  .  lisinopril-hydrochlorothiazide (PRINZIDE,ZESTORETIC) 20-12.5 MG tablet, Take 1 tablet by mouth daily., Disp: ,  Rfl:  .  metaxalone (SKELAXIN) 800 MG tablet, Take 1 tablet (800 mg total) by mouth 3 (three) times daily., Disp: 21 tablet, Rfl: 0 .  omeprazole (PRILOSEC) 40 MG capsule, Take 40 mg by mouth daily., Disp: , Rfl:  .  traMADol (ULTRAM) 50 MG tablet, Take by  mouth every 6 (six) hours as needed., Disp: , Rfl:  .  venlafaxine (EFFEXOR) 75 MG tablet, Take 75 mg by mouth 2 (two) times daily., Disp: , Rfl:  .  budesonide-formoterol (SYMBICORT) 80-4.5 MCG/ACT inhaler, Inhale 2 puffs into the lungs 2 (two) times daily., Disp: , Rfl:  .  Camphor-Menthol-Methyl Sal (HM SALONPAS PAIN RELIEF) 1.2-5.7-6.3 % PTCH, Apply 1 patch topically 3 (three) times daily. Leave in place for up to 12 hours. (Patient not taking: Reported on 09/29/2016), Disp: 30 patch, Rfl: 0 .  cyclobenzaprine (FLEXERIL) 5 MG tablet, Take 1 tablet (5 mg total) by mouth at bedtime. (Patient not taking: Reported on 09/29/2016), Disp: 15 tablet, Rfl: 0 .  diclofenac (VOLTAREN) 50 MG EC tablet, Take 1 tablet (50 mg total) by mouth 2 (two) times daily. (Patient not taking: Reported on 09/29/2016), Disp: 30 tablet, Rfl: 0 .  fluticasone (FLONASE) 50 MCG/ACT nasal spray, Place 2 sprays into both nostrils daily. (Patient not taking: Reported on 09/29/2016), Disp: 16 g, Rfl: 0 .  ibuprofen (ADVIL,MOTRIN) 600 MG tablet, Take 1 tablet (600 mg total) by mouth every 6 (six) hours as needed. (Patient not taking: Reported on 09/29/2016), Disp: 30 tablet, Rfl: 0 .  methylPREDNISolone (MEDROL DOSEPAK) 4 MG TBPK tablet, follow package directions (Patient not taking: Reported on 09/29/2016), Disp: 21 tablet, Rfl: 0 .  primidone (MYSOLINE) 50 MG tablet, Take 50 mg by mouth 2 (two) times daily., Disp: , Rfl:   Imaging Review  Cervical Imaging: Cervical MR wo contrast: No results found for this or any previous visit. Cervical MR wo contrast:  Results for orders placed in visit on 03/08/07  [place] W/O Cm   Narrative This resulted at a [place]:    left arm numbness  HX DSD  COMMENTS:   PROCEDURE:     MR  - MR CERVICAL SPINE WO CONT  - Mar 08 2007  1:38PM   RESULT:     Multiplanar/multisequence imaging of the cervical spine is  obtained without the administration of gadolinium.   Evaluation of the cervical  cord demonstrates no T1 nor T2 signal  abnormalities.   The craniocervical junction appears unremarkable.   At the C2-C3 level there is no evidence of thecal sac stenosis or exiting  nerve root compression or compromise. A mild central disc protrusion is  appreciated.   At the C3-C4 level a mild disc protrusion is appreciated causing near  complete effacement of the anterior CSF space. There is no significant  thecal sac stenosis or exiting nerve root compression or compromise.   At the C4-C5 level a broad-based disc bulge is appreciated causing partial  effacement of the anterior CSF space. There is mild encroachment upon the  exiting nerve root sheaths bilaterally without evidence of exiting nerve  root compression nor compromise.   At the C5-C6 level a broad-based disc bulge is appreciated causing near  complete effacement of the anterior CSF space and moderate thecal sac  narrowing. There is lateralization of the broad-based disc bulge which  causes mass-effect upon the exiting nerve root sheaths and bilateral  exiting  nerve root compromise as well as compression on the RIGHT  is a diagnostic  consideration.   At the C6-C7 level a broad-based disc bulge is appreciated. This causes  partial effacement of the anterior CSF space and demonstrates  lateralization  to the RIGHT and LEFT. Encroachment upon the exiting nerve root sheath on  the RIGHT is appreciated and to a lesser extent on the LEFT. Exiting nerve  root compromise as well as possible compression on the RIGHT is a  diagnostic  consideration.   At the C7-T1 level there is no evidence of thecal sac stenosis or exiting  nerve root compression or compromise.   IMPRESSION:   1)Multilevel degenerative disc disease which appears to be most severe at  the C5-C6 level with moderate thecal sac stenosis and bilateral exiting  nerve root compromise as well as possible compression on the RIGHT. Mild  thecal sac narrowing is  appreciated at the C6-C7 level with bilateral  exiting nerve root compromise and possible compression on the RIGHT at  this  level too. Bilateral exiting nerve root compromise at the C4-C5 level  secondary to a broad-based disc bulge is appreciated.   2)Surgical consultation is recommended if not already obtained.   Thank you for the opportunity to contribute to the care of your patient.        Shoulder-R MR wo contrast:  Results for orders placed in visit on 04/19/05  MR Shoulder Right Wo Contrast   Narrative   PRIOR REPORT IMPORTED FROM THE [facility] WORKFLOW SYSTEM   REASON FOR EXAM:  RT shoulder pain, evaluate rotator cuff  COMMENTS:   PROCEDURE:     MR  - MR SHOULDER RT  WO CONTRAST  - Apr 19 2005  5:49PM   RESULT:   HISTORY:  RIGHT shoulder pain.   COMPARISON STUDIES:  None.   PROCEDURE AND FINDINGS:  Multiplanar/multisequence imaging of the RIGHT  shoulder was obtained.  There is a long oblique approximately 1.5 cm tear  of  the distal supraspinatus tendon approximately beginning approximately 1 cm  to 2 cm proximal to its distal insertion.  There is no tendinous  retraction.   There is a partial articular surface tear of the infraspinatus tendon.  A  high-grade partial tear of the distal subscapularis tendon is present over  an approximately 2 cm segment of the distal tendon.  Bicipital tendon is  intact and in place.  Humeral head is intact.  Glenoid labrum is intact.   IMPRESSION:   Complete tear of the distal supraspinatus tendon, no tendinous retraction.  The tear occurs over approximately 1.5 cm and is oblique beginning  approximately 1 to 2 cm proximal to the distal tendinous insertion.   Subtle articular surface partial tear of the distal infraspinatus tendon.   High-grade partial tear over the distal subscapularis tendon coursing over  approximately 1 to 2 cm.   Thank you for this opportunity to contribute to the care of your patient.        Shoulder-L MR wo contrast:  Results for orders placed in visit on 11/22/13  MR Shoulder Left Wo Contrast   Narrative  CLINICAL DATA:  Left shoulder pain and limited range of motion since  a fall last month while walking the dog.   EXAM:  MRI OF THE LEFT SHOULDER WITHOUT CONTRAST   TECHNIQUE:  Multiplanar, multisequence MR imaging of the shoulder was performed.  No intravenous contrast was administered.   COMPARISON:  Radiographs dated 10/02/2013   FINDINGS:  Rotator cuff: There is a 2 cm diameter full-thickness retracted  tear  of the distal supraspinatus tendon as well as a wide full-thickness  retracted tear of the subscapularis tendon. There is degenerative  tendinosis of the articular surface of the distal infraspinatus.  Teres minor is intact.   Muscles:  There is slight atrophy of the supraspinatus muscle.   Biceps long head: Long head of the biceps tendon is torn adjacent to  its origin and distally retracted. The stump of the tendon origin is  flipped above the degenerated superior labrum.   Acromioclavicular Joint: Severe arthritic changes of the  acromioclavicular joint. Hypertrophy of the distal clavicle could  predispose to impingement.   Glenohumeral Joint: Moderate glenohumeral joint effusion.   Labrum: The anterior labrum is not well seen. Degeneration of the  superior labrum.   Bones:  Extra-articular bones are normal.   IMPRESSION:  1. Full-thickness retracted tears of the supraspinatus and  subscapularis tendons.  2. Complete rupture of the origin of the long head of the biceps  tendon with distal retraction.  3. Degenerative tendinosis of the articular surface of the distal  infraspinatus tendon.  4. Prominent AC joint arthropathy.    Electronically Signed    By: Rozetta Nunnery M.D.    On: 11/22/2013 16:43       Shoulder-L DG:  Results for orders placed in visit on 10/02/13  DG Shoulder Left   Narrative  CLINICAL DATA:  Left shoulder  pain   EXAM:  DG SHOULDER 3+VIEWS LEFT   COMPARISON:  None.   FINDINGS:  No fracture or dislocation. Mild osteoarthritis of the left  glenohumeral joint. Ossific fragment adjacent to the inferior margin  of the glenoid likely representing a small loose body. Mild  degenerative changes of the acromioclavicular joint. No lytic or  sclerotic osseous lesion. Normal soft tissues.   IMPRESSION:  No acute osseous injury of the left shoulder.    Electronically Signed    By: Kathreen Devoid    On: 10/02/2013 17:25         Thoracic MR wo contrast:  Results for orders placed in visit on 06/26/04  MR [place] W/O Cm   Narrative   PRIOR REPORT IMPORTED FROM THE [facility] WORKFLOW SYSTEM   REASON FOR EXAM:  Mid and low back pain  COMMENTS:   PROCEDURE:     MR  - MR THORACIC SPINE WO  - Jun 26 2004  1:20PM   RESULT:     Multiplanar/multisequence imaging of the thoracic spine is  obtained.   Multiple signal abnormalities are noted within the thecal sac. The signal  abnormalities appear to be intradural/extramedullary. These may just  represent pulsation artifacts; however, to further evaluate this patient  and  to exclude the presence of an intrathecal/extramedullary process, MRI of  thoracic spine with Gadolinium enhancement is suggested. No other focal  abnormalities are identified. There is no evidence of thoracic disc  protrusion.   IMPRESSION:   1.     No evidence of thoracic disc protrusion.  2.     Intrathecal signal abnormality is noted. These may represent  pulsation artifacts; however, intrathecal/extramedullary lesions in the  thoracic canal cannot be excluded. For further evaluation, thoracic MRI  with  Gadolinium enhancement is suggested.       Thoracic MR w/wo contrast:  Results for orders placed in visit on 08/31/04  MR Thoracic Spine W Wo Contrast   Narrative   PRIOR REPORT IMPORTED FROM THE [facility] WORKFLOW SYSTEM   REASON FOR EXAM:  Mid and Low Back  Pain and Lower Extr Pain  COMMENTS:   PROCEDURE:     MR  - MR THORACIC SPINE WO/W  - Aug 31 2004  8:17AM   RESULT:     Multiplanar/multisequence imaging of the thoracic spine is  obtained with Gadolinium enhancement. No evidence of disc protrusion, no  evidence of enhancement.  The paraspinal regions are normal. No bony  lesions  are noted.   IMPRESSION:   1)No significant abnormalities identified. There is no evidence of disc  protrusion.   2)The previously identified intrathecal signal abnormality most likely  represented pulsation artifact. No enhancing lesions are identified.         Lumbosacral Imaging: Lumbar MR wo contrast:  Results for orders placed during the hospital encounter of 07/01/16  MR LUMBAR SPINE WO CONTRAST   Narrative CLINICAL DATA:  Constant back pain with recurring numbness in the left hip, leg and foot. Difficulty walking. Previous fusion surgery 2012.  EXAM: MRI LUMBAR SPINE WITHOUT CONTRAST  TECHNIQUE: Multiplanar, multisequence MR imaging of the lumbar spine was performed. No intravenous contrast was administered.  COMPARISON:  CT abdomen 01/29/2016.  Lumbar MRI 01/23/2014.  FINDINGS: Segmentation:  5 lumbar type vertebral bodies  Alignment:  Mild curvature convex to the left with the apex at L3.  Vertebrae: Benign appearing hemangioma on the right at T12. Discogenic marrow changes L2 to sacrum.  Conus medullaris: Extends to the L1 level and appears normal.  Paraspinal and other soft tissues: Negative  Disc levels:  T12-L1:  Normal.  L1-2: Mild bulging of the disc. Mild facet osteoarthritis. No stenosis.  L2-3: Shallow protrusion of the disc. Mild facet and ligamentous hypertrophy. Mild indentation of the thecal sac but no apparent compressive stenosis. Similar appearance to the previous study.  L3-4: Previous interspinous device. Previous lateral discectomy with interbody spacer. No disc bulge or herniation. No  compressive stenosis. Similar appearance to the previous study.  L4-5: Previous interspinous device. Previous lateral discectomy with interbody spacer. Endplate osteophytes and bulging of disc material more prominent towards the left. Mild facet hypertrophy. Narrowing of the left lateral recess and intervertebral foramen on the left that could possibly be symptomatic. Similar appearance to the previous study.  L5-S1: Disc degeneration with bulging and left posterolateral to foraminal to extraforaminal disc protrusion. Mild facet hypertrophy. No compressive stenosis in the canal. Foraminal to extraforaminal encroachment on the left that could continued to compress the left L5 nerve.  IMPRESSION: No appreciable change since the study of 01/23/2014.  L2-3: Shallow disc protrusion. Mild facet and ligamentous hypertrophy. No compressive stenosis.  L3-4: Interspinous device. Facet hypertrophy. No compressive stenosis.  L4-5: Interspinous device. Facet hypertrophy. Endplate osteophytes and bulging disc material more prominent towards the left. Left lateral recess and foraminal narrowing that could be symptomatic. Similar appearance to the previous study.  L5-S1: Bulging disc. Focal protrusion in the left foraminal to extraforaminal region with some potential to compress the left L5 nerve. Similar appearance to the previous study.   Electronically Signed   By: Nelson Chimes M.D.   On: 07/01/2016 15:00    Lumbar MR wo contrast:  Results for orders placed in visit on 04/07/15  MR [facility] W/O Cm   Narrative   PRIOR REPORT IMPORTED FROM THE [facility] WORKFLOW SYSTEM   REASON FOR EXAM:  mid and low back pain     low extremity pain and  numbness  COMMENTS:   PROCEDURE:     MR  -  MR LUMBAR SPINE WO CONTRAST  - Jun 26 2004  1:20PM   RESULT:     Multiplanar/multisequence imaging of the lumbar spine is  obtained. There is straightening of the lumbar spine.  The lumbar  vertebrae   are numbered with the lowest segmented vertebra as L5.  Multilevel disc  degeneration is present particularly at L3-L4 and L4-L5.  Prominent  annular  bulges along with facetal hypertrophy are noted at these levels with  prominent areas of thecal sac narrowing at these levels.  The thecal sac  narrows to approximately 5 mm at these levels. No disc protrusions are  identified. No paraspinal lesions are identified.   IMPRESSION:   1)Lumbar vertebrae numbered with the lowest segmented vertebral shaped  body  being labeled L5.  These are labeled on the film in red wax crayon.   2)L3-L4 and L4-L5 prominent annular bulges along with prominent facetal  hypertrophy resulting in prominent narrowing of the thecal sac at these  levels.        Lumbar DG 2-3 views:  Results for orders placed during the hospital encounter of 12/02/08  DG Lumbar Spine 2-3 Views   Narrative Clinical Data: Low back pain   LUMBAR SPINE - 2-3 VIEW   Comparison: 08/26/2008   Findings: Stable appearance of the posterior fixation at L3-4 and L4-5.  Interbody fusion material stable in appearance and position. L3-4 and L4-5  x-stop devices stable.  Vertebral body sclerosis is appreciated at L3-4 and L4-5.   IMPRESSION: Stable postoperative changes.  Provider: Dario Guardian   Lumbar DG (Complete) 4+V:  Results for orders placed during the hospital encounter of 01/01/08  DG Lumbar Spine Complete   Narrative Clinical Data: Low back and bilateral leg pain   LUMBAR SPINE - COMPLETE 4+ VIEW   Comparison: None   Findings: There is marked disc space narrowing at L3-4 and L4-5. There is endplate sclerosis and osteophytic formation.  There is retrolisthesis of L4 on L5 by approximately 3 mm. Slight left lateral listhesis of L4 in relation to L3 and L5 bodies.  There is anterolisthesis of L3 on L4 by approximately 5 mm.  No pars defects.  Decreased range of motion with flexion and extension.    IMPRESSION: Advanced DDD at L3-4 and L4-5.  Mild spondylolisthesis at L3-4 and minimal retrolisthesis of L4 on L5.  Provider: Felipe Drone    Hip-L DG 2-3 views:  Results for orders placed during the hospital encounter of 07/08/14  DG HIP UNILAT WITH PELVIS 2-3 VIEWS LEFT   Narrative CLINICAL DATA:  Posterior left hip pain beginning the night of 07/07/2014 after the patient heard a pop while walking upstairs. Initial encounter.  EXAM: LEFT HIP (WITH PELVIS) 2-3 VIEWS  COMPARISON:  None.  FINDINGS: No acute bony or joint abnormality is identified. No evidence of avascular necrosis of the femoral heads is seen. There is no notable degenerative change about the hips, symphysis pubis or sacroiliac joints. The patient is status post lower lumbar fusion.  IMPRESSION: No acute abnormality or finding to explain the patient's symptoms.   Electronically Signed   By: Inge Rise M.D.   On: 07/09/2014 08:28      Knee Imaging: Knee-R MR w contrast: No results found for this or any previous visit. Knee-L MR w contrast:  Results for orders placed in visit on 11/18/05  MR Knee Left  Wo Contrast   Narrative  PRIOR REPORT IMPORTED FROM THE [facility] WORKFLOW SYSTEM   REASON FOR  EXAM:   Bilateral knee pain eval for tear  COMMENTS:   PROCEDURE:     MR  - MR KNEE LT  WO CONTRAST  - Nov 18 2005 10:50AM   RESULT:   HISTORY:  Bilateral knee pain.   COMPARISON STUDIES:   RIGHT knee series dated 12/24/04.   PROCEDURE AND FINDINGS:  Multiplanar/multisequence imaging of the RIGHT  knee  is obtained.  The quadriceps and patellar tendons are intact.  Prepatellar  edema is noted.  Anterior cruciate ligament is torn.  The posterior  cruciate  is intact.  The posterior horn of the medial meniscus is not well  identified  and is most likely maceration.  The anterior horn of the medial meniscus  is  intact.  The lateral meniscus is intact.   Collateral ligaments are  intact.  There  is edema in the medial tibial plateau.  A bony density is noted in  the  medial popliteal space medial to the medial head of the gastrocnemius.   This  could represent a loose body.  A donor site is not definitely identified.  Severe subchondral change is present about the medial tibial plateau and  medial femoral condyle.   IMPRESSION:   1.     Macerated tear of the posterior horn of the medial meniscus.  2.     Anterior cruciate ligamentous tear.  There is anterior subluxation  of  the tibia.  3.     Loose body noted along the medial popliteal space medial to the  medial head of the gastrocnemius.  4.     Subchondral degenerative change about the medial femoral plateau  and  femoral condyle.   Thank you for the opportunity to contribute to the care of your patient.       Knee-R MR wo contrast:  Results for orders placed in visit on 04/10/15  MR Knee Right Wo Contrast   Narrative  PRIOR REPORT IMPORTED FROM THE [facility] WORKFLOW SYSTEM   REASON FOR EXAM:    Bilateral knee pain eval for tear  COMMENTS:   PROCEDURE:     MR  - MR KNEE RT  WO  CONTRAST  - Nov 18 2005 10:50AM   RESULT:   HISTORY:  Knee pain.   PROCEDURE AND FINDINGS:   Multiplanar/multisequence imaging of the RIGHT  knee is obtained.  There is a severe anterior cruciate ligamentous tear.  The posterior cruciate ligament is intact. Complex tear of the posterior  horn of the medial meniscus is noted.  The lateral meniscus is intact.   The  collateral ligaments are intact.  There is a large knee joint effusion  with  suprapatellar plicae. Subchondral degenerative changes are present over  the  medial femoral condyle.  An osteochondral defect is noted involving the  medial femoral condyle.   IMPRESSION:   1.     Complete anterior cruciate ligamentous tear.  2.     Large knee joint effusion with suprapatellar plicae.  3.     Complex tear of the posterior horn of the medial meniscus.  4.     Extensive  subchondral degenerative change about the medial femoral  condyle and to a lesser extent the medial tibial plateau.   Thank you for the opportunity to contribute to the care of your patient.        Knee-R DG 1-2 views:  Results for orders placed in visit on 02/10/06  DG Knee 1-2 Views Right   Narrative  PRIOR REPORT IMPORTED FROM THE [facility] WORKFLOW SYSTEM   REASON FOR EXAM:    postop  COMMENTS:  Bedside (portable):Y   PROCEDURE:     DXR - DXR KNEE RIGHT AP AND LATERAL  - Feb 10 2006  1:56PM   RESULT:     AP and lateral views of the RIGHT knee show the patient to be  status post RIGHT knee replacement.  No fracture about the femoral or  tibial  prosthetic components is seen.  There is no dislocation of the prosthetic  knee joint.  Surgical drains are noted.   IMPRESSION:   The patient is status post RIGHT knee replacement.  No abnormal  postoperative changes are identified.   Thank you for this opportunity to contribute to the care of your patient.       Knee-L DG 1-2 views:  Results for orders placed in visit on 07/17/12  DG Knee 1-2 Views Left   Narrative    PRIOR REPORT IMPORTED FROM THE [facility] WORKFLOW SYSTEM   REASON FOR EXAM:    post op  COMMENTS:   Bedside (portable):Y   PROCEDURE:     DXR - DXR KNEE LEFT AP AND LATERAL  - Jul 17 2012  3:47PM   RESULT:     Left knee images demonstrate the patient status post left knee  arthroplasty. There is no immediate postoperative bone or hardware  complication. Alignment is maintained.   IMPRESSION:      Please see above.   Dictation Site: 2        Knee-R DG 4 views:  Results for orders placed in visit on 12/24/04  DG Knee Complete 4 Views Right   Narrative    PRIOR REPORT IMPORTED FROM THE [employer] WORKFLOW SYSTEM   REASON FOR EXAM:  Pain  COMMENTS:   PROCEDURE:     DXR - DXR KNEE RT COMP WITH OBLIQUES  - Dec 24 2004 11:00AM   RESULT:     Multiple views reveal a small joint effusion present. No acute   fractures or dislocations are identified. If one is concerned for internal  derangement, knee MRI could be performed.   IMPRESSION:     Small, RIGHT joint effusion. No additional abnormalities  are  seen.   Thank you for this opportunity to contribute to the care of your patient.         Complexity Note: Imaging results reviewed. Results discussed using Layman's terms.               ROS  Cardiovascular History: High blood pressure, Chest pain and Heart murmur Pulmonary or Respiratory History: Shortness of breath Neurological History: No reported neurological signs or symptoms such as seizures, abnormal skin sensations, urinary and/or fecal incontinence, being born with an abnormal open spine and/or a tethered spinal cord Review of Past Neurological Studies:  Results for orders placed or performed during the hospital encounter of 06/29/15  CT Head Wo Contrast   Narrative   CLINICAL DATA:  New onset tremors  EXAM: CT HEAD WITHOUT CONTRAST  TECHNIQUE: Contiguous axial images were obtained from the base of the skull through the vertex without intravenous contrast.  COMPARISON:  Jun 24, 2007  FINDINGS: Paranasal sinuses, mastoid air cells, and bones are normal. Extracranial soft tissues are unremarkable. Significant streak artifact limits evaluation. Within this limitation, no subdural, epidural, or subarachnoid hemorrhage identified. No mass, mass effect, or midline shift. Ventricles and sulci are normal and unchanged. The cerebellum, brainstem, and basal cisterns are  normal. No acute cortical ischemia or infarct.  IMPRESSION: No abnormalities identified. The study is mildly limited due to streak artifact.   Electronically Signed   By: Dorise Bullion III M.D   On: 06/29/2015 18:54    Psychological-Psychiatric History: Anxiousness, Depressed and Prone to panicking Gastrointestinal History: Heartburn due to stomach pushing into lungs (Hiatal hernia), Reflux or  heatburn, Inflamed pancreas (Pancreatitis) and Irregular, infrequent bowel movements (Constipation) Genitourinary History: Passing kidney stones Hematological History: Brusing easily and Bleeding easily Endocrine History: No reported endocrine signs or symptoms such as high or low blood sugar, rapid heart rate due to high thyroid levels, obesity or weight gain due to slow thyroid or thyroid disease Rheumatologic History: Joint aches and or swelling due to excess weight (Osteoarthritis), Rheumatoid arthritis and Generalized muscle aches (Fibromyalgia) Musculoskeletal History: Negative for myasthenia gravis, muscular dystrophy, multiple sclerosis or malignant hyperthermia Work History: Disabled  Allergies  Ms. Sparkman is allergic to codeine and penicillins.  Laboratory Chemistry  Inflammation Markers (CRP: Acute Phase) (ESR: Chronic Phase) Lab Results  Component Value Date   ESRSEDRATE 8 07/04/2012                 Renal Function Markers Lab Results  Component Value Date   BUN 29 (H) 06/29/2015   CREATININE 1.25 (H) 06/29/2015   GFRAA 51 (L) 06/29/2015   GFRNONAA 44 (L) 06/29/2015                 Hepatic Function Markers Lab Results  Component Value Date   AST 83 (H) 06/29/2015   ALT 71 (H) 06/29/2015   ALBUMIN 4.1 06/29/2015   ALKPHOS 51 06/29/2015                 Electrolytes Lab Results  Component Value Date   NA 140 06/29/2015   K 4.2 06/29/2015   CL 110 06/29/2015   CALCIUM 9.1 06/29/2015                 Neuropathy Markers No results found for: [id number]               Bone Pathology Markers Lab Results  Component Value Date   ALKPHOS 51 06/29/2015   CALCIUM 9.1 06/29/2015                 Coagulation Parameters Lab Results  Component Value Date   INR 0.9 07/04/2012   LABPROT 12.8 07/04/2012   APTT 30.0 07/04/2012   PLT 189 06/29/2015                 Cardiovascular Markers Lab Results  Component Value Date   BNP 22.0 06/29/2015   HGB 12.2  06/29/2015   HCT 35.7 06/29/2015                 Note: Lab results reviewed.  PFSH  Drug: Ms. Metzinger  reports that she does not use drugs. Alcohol:  reports that she does not drink alcohol. Tobacco:  reports that she has never smoked. She has never used smokeless tobacco. Medical:  has a past medical history of Anxiety; Arthritis; Chronic pain; Enlarged heart; High cholesterol; and Hypertension. Family: family history includes Cancer in her father; Hypertension in her mother.  Past Surgical History:  Procedure Laterality Date  . BACK SURGERY    . KNEE SURGERY    . SHOULDER SURGERY     Active Ambulatory Problems    Diagnosis Date Noted  . No Active Ambulatory Problems   Resolved  Ambulatory Problems    Diagnosis Date Noted  . No Resolved Ambulatory Problems   Past Medical History:  Diagnosis Date  . Anxiety   . Arthritis   . Chronic pain   . Enlarged heart   . High cholesterol   . Hypertension    Constitutional Exam  General appearance: Well nourished, well developed, and well hydrated. In no apparent acute distress Vitals:   09/29/16 1106  BP: (!) 140/54  Pulse: 88  Resp: 20  Temp: 98 F (36.7 C)  TempSrc: Oral  SpO2: 95%  Weight: 207 lb 3.2 oz (94 kg)  Height: '5\' 5"'$  (1.651 m)   BMI Assessment: Estimated body mass index is 34.48 kg/m as calculated from the following:   Height as of this encounter: '5\' 5"'$  (1.651 m).   Weight as of this encounter: 207 lb 3.2 oz (94 kg).  BMI interpretation table: BMI level Category Range association with higher incidence of chronic pain  <18 kg/m2 Underweight   18.5-24.9 kg/m2 Ideal body weight   25-29.9 kg/m2 Overweight Increased incidence by 20%  30-34.9 kg/m2 Obese (Class I) Increased incidence by 68%  35-39.9 kg/m2 Severe obesity (Class II) Increased incidence by 136%  >40 kg/m2 Extreme obesity (Class III) Increased incidence by 254%   BMI Readings from Last 4 Encounters:  09/29/16 34.48 kg/m  06/02/16 36.61 kg/m   05/28/16 36.78 kg/m  05/09/16 34.28 kg/m   Wt Readings from Last 4 Encounters:  09/29/16 207 lb 3.2 oz (94 kg)  06/02/16 220 lb (99.8 kg)  05/28/16 221 lb (100.2 kg)  05/09/16 206 lb (93.4 kg)  Psych/Mental status: Alert, oriented x 3 (person, place, & time)       Eyes: PERLA Respiratory: No evidence of acute respiratory distress  Cervical spine: Tender to palpation overlying cervical facets, pain with cervical extension.   Lumbar Spine Area Exam  Skin & Axial Inspection: Well healed scar from previous spine surgery detected Alignment: Asymmetric Functional ROM: Unrestricted ROM      Stability: No instability detected Muscle Tone/Strength: Functionally intact. No obvious neuro-muscular anomalies detected. Sensory (Neurological): Movement-associated pain Palpation: Complains of area being tender to palpation       Provocative Tests: Lumbar Hyperextension and rotation test: Positive      left greater than right Lumbar Lateral bending test: Positive      left greater than right Patrick's Maneuver: Positive                    positive straight leg raise test left greater than right  Gait & Posture Assessment  Ambulation: Unassisted Gait: Relatively normal for age and body habitus Posture: WNL   Lower Extremity Exam    Side: Right lower extremity  Side: Left lower extremity  Skin & Extremity Inspection: Skin color, temperature, and hair growth are WNL. No peripheral edema or cyanosis. No masses, redness, swelling, asymmetry, or associated skin lesions. No contractures.  Skin & Extremity Inspection: Skin color, temperature, and hair growth are WNL. No peripheral edema or cyanosis. No masses, redness, swelling, asymmetry, or associated skin lesions. No contractures.  Functional ROM: Unrestricted ROM          Functional ROM: Unrestricted ROM          Muscle Tone/Strength: Functionally intact. No obvious neuro-muscular anomalies detected.  Muscle Tone/Strength: Functionally intact. No  obvious neuro-muscular anomalies detected.  Sensory (Neurological): Unimpaired  Sensory (Neurological): Unimpaired  Palpation: No palpable anomalies  Palpation: No palpable anomalies    Assessment and  plan:   1. Chronic left lumbar radiculopathy   2. DDD (degenerative disc disease), lumbar   3. Chronic left-sided low back pain with left-sided sciatica     66 year old female with chronic low back pain with spinal stenosis, history of L3/L4, L4/L5 lumbar fusion, L2/L3 and L5/S1 severe degenerative disc disease, chronic lumbosacral radiculopathy more pronounced on the left he referred to [place] as a fast-track for caudal epidural steroid injection. Patient is not on any blood thinners. Risks and benefits of procedure discussed and patient wants to proceed.  -Schedule for LEFT sided caudal epidural steroid injection under fluoroscopy  Future: -patient endorses neck pain with radiation to bilateral hands, +pain with cervical extension, Pain overlying cervical facets can consider cervical medial branch nerve blocks with goal radiofrequency ablation. Recommend obtaining cervical spine x-rays prior.  Orders Placed This Encounter  Procedures  . Caudal Epidural Injection    Standing Status:   Future    Standing Expiration Date:   10/29/2016    Scheduling Instructions:     Level(s): Sacrococcygeal canal (Tailbone area)     Sedation: With Sedation     Scheduling Timeframe: As soon as pre-approved          Caudal ESI- LEFT LEG PAIN    Order Specific Question:   Where will this procedure be performed?    Answer:   [facility]

## 2016-09-29 NOTE — Progress Notes (Signed)
Safety precautions to be maintained throughout the outpatient stay will include: orient to surroundings, keep bed in low position, maintain call bell within reach at all times, provide assistance with transfer out of bed and ambulation.  

## 2016-09-29 NOTE — Patient Instructions (Addendum)
-Caudal ESI- directed towards to left next available  Pain Management Discharge Instructions  General Discharge Instructions :  If you need to reach your doctor call: Monday-Friday 8:00 am - 4:00 pm at [phone] or toll free [phone].  After clinic hours [phone] to have operator reach doctor.  Bring all of your medication bottles to all your appointments in the pain clinic.  To cancel or reschedule your appointment with Pain Management please remember to call 24 hours in advance to avoid a fee.  Refer to the educational materials which you have been given on: General Risks, I had my Procedure. Discharge Instructions, Post Sedation.  Post Procedure Instructions:  The drugs you were given will stay in your system until tomorrow, so for the next 24 hours you should not drive, make any legal decisions or drink any alcoholic beverages.  You may eat anything you prefer, but it is better to start with liquids then soups and crackers, and gradually work up to solid foods.  Please notify your doctor immediately if you have any unusual bleeding, trouble breathing or pain that is not related to your normal pain.  Depending on the type of procedure that was done, some parts of your body may feel week and/or numb.  This usually clears up by tonight or the next day.  Walk with the use of an assistive device or accompanied by an adult for the 24 hours.  You may use ice on the affected area for the first 24 hours.  Put ice in a Ziploc bag and cover with a towel and place against area 15 minutes on 15 minutes off.  You may switch to heat after 24 hours.GENERAL RISKS AND COMPLICATIONS  What are the risk, side effects and possible complications? Generally speaking, most procedures are safe.  However, with any procedure there are risks, side effects, and the possibility of complications.  The risks and complications are dependent upon the sites that are lesioned, or the type of nerve block to  be performed.  The closer the procedure is to the spine, the more serious the risks are.  Great care is taken when placing the radio frequency needles, block needles or lesioning probes, but sometimes complications can occur. 1. Infection: Any time there is an injection through the skin, there is a risk of infection.  This is why sterile conditions are used for these blocks.  There are four possible types of infection. 1. Localized skin infection. 2. Central Nervous System Infection-This can be in the form of Meningitis, which can be deadly. 3. Epidural Infections-This can be in the form of an epidural abscess, which can cause pressure inside of the spine, causing compression of the spinal cord with subsequent paralysis. This would require an emergency surgery to decompress, and there are no guarantees that the patient would recover from the paralysis. 4. Discitis-This is an infection of the intervertebral discs.  It occurs in about 1% of discography procedures.  It is difficult to treat and it may lead to surgery.        2. Pain: the needles have to go through skin and soft tissues, will cause soreness.       3. Damage to internal structures:  The nerves to be lesioned may be near blood vessels or    other nerves which can be potentially damaged.       4. Bleeding: Bleeding is more common if the patient is taking blood thinners such as  aspirin, Coumadin, Ticiid, Plavix, etc., or  if he/she have some genetic predisposition  such as hemophilia. Bleeding into the spinal canal can cause compression of the spinal  cord with subsequent paralysis.  This would require an emergency surgery to  decompress and there are no guarantees that the patient would recover from the  paralysis.       5. Pneumothorax:  Puncturing of a lung is a possibility, every time a needle is introduced in  the area of the chest or upper back.  Pneumothorax refers to free air around the  collapsed lung(s), inside of the thoracic cavity  (chest cavity).  Another two possible  complications related to a similar event would include: Hemothorax and Chylothorax.   These are variations of the Pneumothorax, where instead of air around the collapsed  lung(s), you may have blood or chyle, respectively.       6. Spinal headaches: They may occur with any procedures in the area of the spine.       7. Persistent CSF (Cerebro-Spinal Fluid) leakage: This is a rare problem, but may occur  with prolonged intrathecal or epidural catheters either due to the formation of a fistulous  track or a dural tear.       8. Nerve damage: By working so close to the spinal cord, there is always a possibility of  nerve damage, which could be as serious as a permanent spinal cord injury with  paralysis.       9. Death:  Although rare, severe deadly allergic reactions known as "Anaphylactic  reaction" can occur to any of the medications used.      10. Worsening of the symptoms:  We can always make thing worse.  What are the chances of something like this happening? Chances of any of this occuring are extremely low.  By statistics, you have more of a chance of getting killed in a motor vehicle accident: while driving to the hospital than any of the above occurring .  Nevertheless, you should be aware that they are possibilities.  In general, it is similar to taking a shower.  Everybody knows that you can slip, hit your head and get killed.  Does that mean that you should not shower again?  Nevertheless always keep in mind that statistics do not mean anything if you happen to be on the wrong side of them.  Even if a procedure has a 1 (one) in a 1,000,000 (million) chance of going wrong, it you happen to be that one..Also, keep in mind that by statistics, you have more of a chance of having something go wrong when taking medications.  Who should not have this procedure? If you are on a blood thinning medication (e.g. Coumadin, Plavix, see list of "Blood Thinners"), or if  you have an active infection going on, you should not have the procedure.  If you are taking any blood thinners, please inform your physician.  How should I prepare for this procedure?  Do not eat or drink anything at least six hours prior to the procedure.  Bring a driver with you .  It cannot be a taxi.  Come accompanied by an adult that can drive you back, and that is strong enough to help you if your legs get weak or numb from the local anesthetic.  Take all of your medicines the morning of the procedure with just enough water to swallow them.  If you have diabetes, make sure that you are scheduled to have your procedure done first thing in  the morning, whenever possible.  If you have diabetes, take only half of your insulin dose and notify our nurse that you have done so as soon as you arrive at the clinic.  If you are diabetic, but only take blood sugar pills (oral hypoglycemic), then do not take them on the morning of your procedure.  You may take them after you have had the procedure.  Do not take aspirin or any aspirin-containing medications, at least eleven (11) days prior to the procedure.  They may prolong bleeding.  Wear loose fitting clothing that may be easy to take off and that you would not mind if it got stained with Betadine or blood.  Do not wear any jewelry or perfume  Remove any nail coloring.  It will interfere with some of our monitoring equipment.  NOTE: Remember that this is not meant to be interpreted as a complete list of all possible complications.  Unforeseen problems may occur.  BLOOD THINNERS The following drugs contain aspirin or other products, which can cause increased bleeding during surgery and should not be taken for 2 weeks prior to and 1 week after surgery.  If you should need take something for relief of minor pain, you may take acetaminophen which is found in Tylenol,m Datril, Anacin-3 and Panadol. It is not blood thinner. The products listed  below are.  Do not take any of the products listed below in addition to any listed on your instruction sheet.  A.P.C or A.P.C with Codeine Codeine Phosphate Capsules #3 Ibuprofen Ridaura  ABC compound Congesprin Imuran rimadil  Advil Cope Indocin Robaxisal  Alka-Seltzer Effervescent Pain Reliever and Antacid Coricidin or Coricidin-D  Indomethacin Rufen  Alka-Seltzer plus Cold Medicine Cosprin Ketoprofen S-A-C Tablets  Anacin Analgesic Tablets or Capsules Coumadin Korlgesic Salflex  Anacin Extra Strength Analgesic tablets or capsules CP-2 Tablets Lanoril Salicylate  Anaprox Cuprimine Capsules Levenox Salocol  Anexsia-D Dalteparin Magan Salsalate  Anodynos Darvon compound Magnesium Salicylate Sine-off  Ansaid Dasin Capsules Magsal Sodium Salicylate  Anturane Depen Capsules Marnal Soma  APF Arthritis pain formula Dewitt's Pills Measurin Stanback  Argesic Dia-Gesic Meclofenamic Sulfinpyrazone  Arthritis Bayer Timed Release Aspirin Diclofenac Meclomen Sulindac  Arthritis pain formula Anacin Dicumarol Medipren Supac  Analgesic (Safety coated) Arthralgen Diffunasal Mefanamic Suprofen  Arthritis Strength Bufferin Dihydrocodeine Mepro Compound Suprol  Arthropan liquid Dopirydamole Methcarbomol with Aspirin Synalgos  ASA tablets/Enseals Disalcid Micrainin Tagament  Ascriptin Doan's Midol Talwin  Ascriptin A/D Dolene Mobidin Tanderil  Ascriptin Extra Strength Dolobid Moblgesic Ticlid  Ascriptin with Codeine Doloprin or Doloprin with Codeine Momentum Tolectin  Asperbuf Duoprin Mono-gesic Trendar  Aspergum Duradyne Motrin or Motrin IB Triminicin  Aspirin plain, buffered or enteric coated Durasal Myochrisine Trigesic  Aspirin Suppositories Easprin Nalfon Trillsate  Aspirin with Codeine Ecotrin Regular or Extra Strength Naprosyn Uracel  Atromid-S Efficin Naproxen Ursinus  Auranofin Capsules Elmiron Neocylate Vanquish  Axotal Emagrin Norgesic Verin  Azathioprine Empirin or Empirin with Codeine  Normiflo Vitamin E  Azolid Emprazil Nuprin Voltaren  Bayer Aspirin plain, buffered or children's or timed BC Tablets or powders Encaprin Orgaran Warfarin Sodium  Buff-a-Comp Enoxaparin Orudis Zorpin  Buff-a-Comp with Codeine Equegesic Os-Cal-Gesic   Buffaprin Excedrin plain, buffered or Extra Strength Oxalid   Bufferin Arthritis Strength Feldene Oxphenbutazone   Bufferin plain or Extra Strength Feldene Capsules Oxycodone with Aspirin   Bufferin with Codeine Fenoprofen Fenoprofen Pabalate or Pabalate-SF   Buffets II Flogesic Panagesic   Buffinol plain or Extra Strength Florinal or Florinal with Codeine Panwarfarin   Buf-Tabs  Flurbiprofen Penicillamine   Butalbital Compound Four-way cold tablets Penicillin   Butazolidin Fragmin Pepto-Bismol   Carbenicillin Geminisyn Percodan   Carna Arthritis Reliever Geopen Persantine   Carprofen Gold's salt Persistin   Chloramphenicol Goody's Phenylbutazone   Chloromycetin Haltrain Piroxlcam   Clmetidine heparin Plaquenil   Cllnoril Hyco-pap Ponstel   Clofibrate Hydroxy chloroquine Propoxyphen         Before stopping any of these medications, be sure to consult the physician who ordered them.  Some, such as Coumadin (Warfarin) are ordered to prevent or treat serious conditions such as "deep thrombosis", "pumonary embolisms", and other heart problems.  The amount of time that you may need off of the medication may also vary with the medication and the reason for which you were taking it.  If you are taking any of these medications, please make sure you notify your pain physician before you undergo any procedures.         Epidural Steroid Injection Patient Information  Description: The epidural space surrounds the nerves as they exit the spinal cord.  In some patients, the nerves can be compressed and inflamed by a bulging disc or a tight spinal canal (spinal stenosis).  By injecting steroids into the epidural space, we can bring irritated nerves  into direct contact with a potentially helpful medication.  These steroids act directly on the irritated nerves and can reduce swelling and inflammation which often leads to decreased pain.  Epidural steroids may be injected anywhere along the spine and from the neck to the low back depending upon the location of your pain.   After numbing the skin with local anesthetic (like Novocaine), a small needle is passed into the epidural space slowly.  You may experience a sensation of pressure while this is being done.  The entire block usually last less than 10 minutes.  Conditions which may be treated by epidural steroids:   Low back and leg pain  Neck and arm pain  Spinal stenosis  Post-laminectomy syndrome  Herpes zoster (shingles) pain  Pain from compression fractures  Preparation for the injection:  1. Do not eat any solid food or dairy products within 8 hours of your appointment.  2. You may drink clear liquids up to 3 hours before appointment.  Clear liquids include water, black coffee, juice or soda.  No milk or cream please. 3. You may take your regular medication, including pain medications, with a sip of water before your appointment  Diabetics should hold regular insulin (if taken separately) and take 1/2 normal NPH dos the morning of the procedure.  Carry some sugar containing items with you to your appointment. 4. A driver must accompany you and be prepared to drive you home after your procedure.  5. Bring all your current medications with your. 6. An IV may be inserted and sedation may be given at the discretion of the physician.   7. A blood pressure cuff, EKG and other monitors will often be applied during the procedure.  Some patients may need to have extra oxygen administered for a short period. 8. You will be asked to provide medical information, including your allergies, prior to the procedure.  We must know immediately if you are taking blood thinners (like  Coumadin/Warfarin)  Or if you are allergic to IV iodine contrast (dye). We must know if you could possible be pregnant.  Possible side-effects:  Bleeding from needle site  Infection (rare, may require surgery)  Nerve injury (rare)  Numbness & tingling (  temporary)  Difficulty urinating (rare, temporary)  Spinal headache ( a headache worse with upright posture)  Light -headedness (temporary)  Pain at injection site (several days)  Decreased blood pressure (temporary)  Weakness in arm/leg (temporary)  Pressure sensation in back/neck (temporary)  Call if you experience:  Fever/chills associated with headache or increased back/neck pain.  Headache worsened by an upright position.  New onset weakness or numbness of an extremity below the injection site  Hives or difficulty breathing (go to the emergency room)  Inflammation or drainage at the infection site  Severe back/neck pain  Any new symptoms which are concerning to you  Please note:  Although the local anesthetic injected can often make your back or neck feel good for several hours after the injection, the pain will likely return.  It takes 3-7 days for steroids to work in the epidural space.  You may not notice any pain relief for at least that one week.  If effective, we will often do a series of three injections spaced 3-6 weeks apart to maximally decrease your pain.  After the initial series, we generally will wait several months before considering a repeat injection of the same type.  If you have any questions, please call [phone] [facility]

## 2016-10-13 ENCOUNTER — Ambulatory Visit (HOSPITAL_BASED_OUTPATIENT_CLINIC_OR_DEPARTMENT_OTHER): Payer: Medicare HMO | Admitting: Student in an Organized Health Care Education/Training Program

## 2016-10-13 ENCOUNTER — Encounter: Payer: Self-pay | Admitting: Student in an Organized Health Care Education/Training Program

## 2016-10-13 ENCOUNTER — Ambulatory Visit
Admission: RE | Admit: 2016-10-13 | Discharge: 2016-10-13 | Disposition: A | Discharge status: Home / Self Care | Payer: Medicare HMO | Source: Ambulatory Visit | Attending: Student in an Organized Health Care Education/Training Program | Admitting: Student in an Organized Health Care Education/Training Program

## 2016-10-13 VITALS — BP 107/49 | Temp 97.9°F | Resp 14 | Ht 65.0 in | Wt 220.0 lb

## 2016-10-13 DIAGNOSIS — Z880 Allergy status to penicillin: Secondary | ICD-10-CM | POA: Diagnosis not present

## 2016-10-13 DIAGNOSIS — G8929 Other chronic pain: Secondary | ICD-10-CM | POA: Diagnosis not present

## 2016-10-13 DIAGNOSIS — M5136 Other intervertebral disc degeneration, lumbar region: Secondary | ICD-10-CM | POA: Diagnosis not present

## 2016-10-13 DIAGNOSIS — Z885 Allergy status to narcotic agent status: Secondary | ICD-10-CM | POA: Insufficient documentation

## 2016-10-13 DIAGNOSIS — M48061 Spinal stenosis, lumbar region without neurogenic claudication: Secondary | ICD-10-CM | POA: Diagnosis not present

## 2016-10-13 DIAGNOSIS — M5116 Intervertebral disc disorders with radiculopathy, lumbar region: Secondary | ICD-10-CM | POA: Diagnosis not present

## 2016-10-13 DIAGNOSIS — M5416 Radiculopathy, lumbar region: Secondary | ICD-10-CM

## 2016-10-13 MED ORDER — IOPAMIDOL (ISOVUE-M 200) INJECTION 41%
10.0000 mL | Freq: Once | INTRAMUSCULAR | Status: AC
  Administered 2016-10-13: 10 mL via EPIDURAL
  Filled 2016-10-13: qty 10

## 2016-10-13 MED ORDER — SODIUM CHLORIDE 0.9 % IJ SOLN
INTRAMUSCULAR | Status: AC
  Filled 2016-10-13: qty 10

## 2016-10-13 MED ORDER — LIDOCAINE HCL (PF) 1 % IJ SOLN
INTRAMUSCULAR | Status: AC
  Filled 2016-10-13: qty 5

## 2016-10-13 MED ORDER — LACTATED RINGERS IV SOLN
1000.0000 mL | Freq: Once | INTRAVENOUS | Status: AC
  Administered 2016-10-13: 1000 mL via INTRAVENOUS

## 2016-10-13 MED ORDER — SODIUM CHLORIDE 0.9% FLUSH
2.0000 mL | Freq: Once | INTRAVENOUS | Status: AC
  Administered 2016-10-13: 10 mL

## 2016-10-13 MED ORDER — FENTANYL CITRATE (PF) 100 MCG/2ML IJ SOLN
25.0000 ug | INTRAMUSCULAR | Status: DC | PRN
  Administered 2016-10-13: 25 ug via INTRAVENOUS

## 2016-10-13 MED ORDER — ROPIVACAINE HCL 2 MG/ML IJ SOLN
2.0000 mL | Freq: Once | INTRAMUSCULAR | Status: AC
  Administered 2016-10-13: 10 mL via EPIDURAL

## 2016-10-13 MED ORDER — LIDOCAINE HCL (PF) 1 % IJ SOLN: 10.0000 mL | Freq: Once | INTRAMUSCULAR | Status: DC

## 2016-10-13 MED ORDER — FENTANYL CITRATE (PF) 100 MCG/2ML IJ SOLN
INTRAMUSCULAR | Status: AC
  Filled 2016-10-13: qty 2

## 2016-10-13 MED ORDER — DEXAMETHASONE SODIUM PHOSPHATE 10 MG/ML IJ SOLN
10.0000 mg | Freq: Once | INTRAMUSCULAR | Status: AC
  Administered 2016-10-13: 10 mg

## 2016-10-13 MED ORDER — ROPIVACAINE HCL 2 MG/ML IJ SOLN
INTRAMUSCULAR | Status: AC
  Filled 2016-10-13: qty 10

## 2016-10-13 MED ORDER — DEXAMETHASONE SODIUM PHOSPHATE 10 MG/ML IJ SOLN
INTRAMUSCULAR | Status: AC
  Filled 2016-10-13: qty 1

## 2016-10-13 NOTE — Progress Notes (Signed)
Sandra Dudley's Name: Sandra Dudley  MRN: [id number]  Referring Provider: Gillis Santa, MD  DOB: [birth year]  PCP: Cletis Athens, MD  DOS: 10/13/2016  Note by: Gillis Santa, MD  Service setting: Ambulatory outpatient  Specialty: Interventional Pain Management  Sandra Dudley type: Established  Location: [facility] (AMB) Pain Management Facility  Visit type: Interventional Procedure   Primary Reason for Visit: Interventional Pain Management Treatment. CC: Back Pain (low and right today)  Procedure:  Anesthesia, Analgesia, Anxiolysis:  Type: Diagnostic Epidural Steroid Injection Region: Caudal Level: Sacrococcygeal   Laterality: Midline       Type: Local Anesthesia with Moderate (Conscious) Sedation Local Anesthetic: Lidocaine 1% Route: Intravenous (IV) IV Access: Secured Sedation: Meaningful verbal contact was maintained at all times during the procedure  Indication(s): Analgesia and Anxiety  Indications: 1. DDD (degenerative disc disease), lumbar   2. Chronic left lumbar radiculopathy    Pain Score: Pre-procedure: 6 /10 Post-procedure: 0-No pain/10  Pre-op Assessment:  Sandra Dudley is a 66 y.o. (year old), female Sandra Dudley, seen today for interventional treatment. She  has a past surgical history that includes Back surgery; Shoulder surgery; and Knee surgery. Sandra Dudley has a current medication list which includes the following prescription(s): albuterol, alprazolam, budesonide-formoterol, fenofibrate, gabapentin, lisinopril-hydrochlorothiazide, tramadol, venlafaxine, camphor-menthol-methyl sal, cyclobenzaprine, diclofenac, fluticasone, ibuprofen, metaxalone, methylprednisolone, omeprazole, and primidone, and the following Facility-Administered Medications: fentanyl and lidocaine (pf). Her primarily concern today is the Back Pain (low and right today)  66 year old female with chronic low back pain with spinal stenosis, history of L3/L4, L4/L5 lumbar fusion, L2/L3 and L5/S1 severe degenerative disc disease,  chronic lumbosacral radiculopathy more pronounced on the left he referred to [place] as a fast-track for caudal epidural steroid injection. Sandra Dudley is not on any blood thinners. Risks and benefits of procedure discussed and Sandra Dudley wants to proceed with caudal epidural steroid injection.  Initial Vital Signs: Blood pressure 120/64, temperature 98 F (36.7 C), temperature source Oral, resp. rate 16, height 5\' 5"  (1.651 m), weight 220 lb (99.8 kg), SpO2 99 %. BMI: Estimated body mass index is 36.61 kg/m as calculated from the following:   Height as of this encounter: 5\' 5"  (1.651 m).   Weight as of this encounter: 220 lb (99.8 kg).  66 year old female with chronic low back pain with spinal stenosis, history of L3/L4, L4/L5 lumbar fusion, L2/L3 and L5/S1 severe degenerative disc disease, chronic lumbosacral radiculopathy more pronounced on the left he referred to [place] as a fast-track for caudal epidural steroid injection. Sandra Dudley is not on any blood thinners. Risks and benefits of procedure discussed and Sandra Dudley wants to proceed with caudal epidural steroid injection under fluoroscopy today.  Risk Assessment: Allergies: Reviewed. She is allergic to codeine and penicillins.  Allergy Precautions: None required Coagulopathies: Reviewed. None identified.  Blood-thinner therapy: None at this time Active Infection(s): Reviewed. None identified. Sandra Dudley is afebrile  Site Confirmation: Sandra Dudley was asked to confirm the procedure and laterality before marking the site Procedure checklist: Completed Consent: Before the procedure and under the influence of no sedative(s), amnesic(s), or anxiolytics, the Sandra Dudley was informed of the treatment options, risks and possible complications. To fulfill our ethical and legal obligations, as recommended by the American Medical Association's Code of Ethics, I have informed the Sandra Dudley of my clinical impression; the nature and purpose of the treatment or procedure; the  risks, benefits, and possible complications of the intervention; the alternatives, including doing nothing; the risk(s) and benefit(s) of the alternative treatment(s) or procedure(s); and the risk(s) and benefit(s) of  doing nothing. The Sandra Dudley was provided information about the general risks and possible complications associated with the procedure. These may include, but are not limited to: failure to achieve desired goals, infection, bleeding, organ or nerve damage, allergic reactions, paralysis, and death. In addition, the Sandra Dudley was informed of those risks and complications associated to Spine-related procedures, such as failure to decrease pain; infection (i.e.: Meningitis, epidural or intraspinal abscess); bleeding (i.e.: epidural hematoma, subarachnoid hemorrhage, or any other type of intraspinal or peri-dural bleeding); organ or nerve damage (i.e.: Any type of peripheral nerve, nerve root, or spinal cord injury) with subsequent damage to sensory, motor, and/or autonomic systems, resulting in permanent pain, numbness, and/or weakness of one or several areas of the body; allergic reactions; (i.e.: anaphylactic reaction); and/or death. Furthermore, the Sandra Dudley was informed of those risks and complications associated with the medications. These include, but are not limited to: allergic reactions (i.e.: anaphylactic or anaphylactoid reaction(s)); adrenal axis suppression; blood sugar elevation that in diabetics may result in ketoacidosis or comma; water retention that in patients with history of congestive heart failure may result in shortness of breath, pulmonary edema, and decompensation with resultant heart failure; weight gain; swelling or edema; medication-induced neural toxicity; particulate matter embolism and blood vessel occlusion with resultant organ, and/or nervous system infarction; and/or aseptic necrosis of one or more joints. Finally, the Sandra Dudley was informed that Medicine is not an exact  science; therefore, there is also the possibility of unforeseen or unpredictable risks and/or possible complications that may result in a catastrophic outcome. The Sandra Dudley indicated having understood very clearly. We have given the Sandra Dudley no guarantees and we have made no promises. Enough time was given to the Sandra Dudley to ask questions, all of which were answered to the Sandra Dudley's satisfaction. Ms. Hiltz has indicated that she wanted to continue with the procedure. Attestation: I, the ordering provider, attest that I have discussed with the Sandra Dudley the benefits, risks, side-effects, alternatives, likelihood of achieving goals, and potential problems during recovery for the procedure that I have provided informed consent. Date: 10/13/2016; Time: 10:37 AM  Pre-Procedure Preparation:  Monitoring: As per clinic protocol. Respiration, ETCO2, SpO2, BP, heart rate and rhythm monitor placed and checked for adequate function Safety Precautions: Sandra Dudley was assessed for positional comfort and pressure points before starting the procedure. Time-out: I initiated and conducted the "Time-out" before starting the procedure, as per protocol. The Sandra Dudley was asked to participate by confirming the accuracy of the "Time Out" information. Verification of the correct person, site, and procedure were performed and confirmed by me, the nursing staff, and the Sandra Dudley. "Time-out" conducted as per Joint Commission's Universal Protocol (UP.01.01.01). "Time-out" Date & Time: 10/13/2016; 1100 hrs.  Description of Procedure Process:   Position: Prone Target Area: Caudal Epidural Canal. Approach: Midline approach. Area Prepped: Entire Posterior Sacrococcygeal Region Prepping solution: ChloraPrep (2% chlorhexidine gluconate and 70% isopropyl alcohol) Safety Precautions: Aspiration looking for blood return was conducted prior to all injections. At no point did we inject any substances, as a needle was being advanced. No attempts were  made at seeking any paresthesias. Safe injection practices and needle disposal techniques used. Medications properly checked for expiration dates. SDV (single dose vial) medications used. Description of the Procedure: Protocol guidelines were followed. The Sandra Dudley was placed in position over the fluoroscopy table. The target area was identified and the area prepped in the usual manner. Skin desensitized using vapocoolant spray. Skin & deeper tissues infiltrated with local anesthetic. Appropriate amount of time allowed to pass  for local anesthetics to take effect. The procedure needles were then advanced to the target area. Proper needle placement secured. Negative aspiration confirmed. Solution injected in intermittent fashion, asking for systemic symptoms every 0.5cc of injectate. The needles were then removed and the area cleansed, making sure to leave some of the prepping solution back to take advantage of its long term bactericidal properties. Vitals:   10/13/16 1109 10/13/16 1119 10/13/16 1129 10/13/16 1139  BP: (!) 153/65 133/63 123/63 (!) 107/49  Resp: 18 14 12 14   Temp:  98.4 F (36.9 C)  97.9 F (36.6 C)  TempSrc:      SpO2: 100% 98% 100% 100%  Weight:      Height:        Start Time: 1104 hrs. End Time: 1109 hrs. Materials:  Needle(s) Type: Epidural needle Gauge: 17G Length: 3.5-in Medication(s): We administered lactated ringers, fentaNYL, ropivacaine (PF) 2 mg/mL (0.2%), iopamidol, sodium chloride flush, and dexamethasone. Please see chart orders for dosing details. 7 cc normal saline, 1 cc Decadron, 2 cc 0.2% ropivacaine equals 10 cc total Imaging Guidance (Spinal):  Type of Imaging Technique: Fluoroscopy Guidance (Spinal) Indication(s): Assistance in needle guidance and placement for procedures requiring needle placement in or near specific anatomical locations not easily accessible without such assistance. Exposure Time: Please see nurses notes. Contrast: Before injecting any  contrast, we confirmed that the Sandra Dudley did not have an allergy to iodine, shellfish, or radiological contrast. Once satisfactory needle placement was completed at the desired level, radiological contrast was injected. Contrast injected under live fluoroscopy. No contrast complications. See chart for type and volume of contrast used. Fluoroscopic Guidance: I was personally present during the use of fluoroscopy. "Tunnel Vision Technique" used to obtain the best possible view of the target area. Parallax error corrected before commencing the procedure. "Direction-depth-direction" technique used to introduce the needle under continuous pulsed fluoroscopy. Once target was reached, antero-posterior, oblique, and lateral fluoroscopic projection used confirm needle placement in all planes. Images permanently stored in EMR. Interpretation: I personally interpreted the imaging intraoperatively. Adequate needle placement confirmed in multiple planes. Appropriate spread of contrast into desired area was observed. No evidence of afferent or efferent intravascular uptake. No intrathecal or subarachnoid spread observed. Permanent images saved into the Sandra Dudley's record.  Antibiotic Prophylaxis:  Indication(s): None identified Antibiotic given: None  Post-operative Assessment:  EBL: None Complications: No immediate post-treatment complications observed by team, or reported by Sandra Dudley. Note: The Sandra Dudley tolerated the entire procedure well. A repeat set of vitals were taken after the procedure and the Sandra Dudley was kept under observation following institutional policy, for this type of procedure. Post-procedural neurological assessment was performed, showing return to baseline, prior to discharge. The Sandra Dudley was provided with post-procedure discharge instructions, including a section on how to identify potential problems. Should any problems arise concerning this procedure, the Sandra Dudley was given instructions to immediately  contact us, at any time, without hesitation. In any case, we plan to contact the Sandra Dudley by telephone for a follow-up status report regarding this interventional procedure. Comments:  No additional relevant information.  Plan of Care  Disposition: Discharge home  Discharge Date & Time: 10/13/2016; 1141 hrs.  5 out of 5 strength bilateral lower extremities on discharge: Plantar flexion, dorsiflexion, knee extension, knee flexion.  Physician-requested Follow-up:  Return in about 2 weeks (around 10/27/2016) for Post Procedure Evaluation.  Future Appointments Date Time Provider [place]  10/28/2016 10:00 AM Gillis Santa, MD [facility] None    Imaging Orders  DG C-Arm 1-60 Min-No Report Procedure Orders    No procedure(s) ordered today    Medications ordered for procedure: Meds ordered this encounter  Medications  . lactated ringers infusion 1,000 mL  . fentaNYL (SUBLIMAZE) injection 25-50 mcg    Make sure Narcan is available in the pyxis when using this medication. In the event of respiratory depression (RR< 8/min): Titrate NARCAN (naloxone) in increments of 0.1 to 0.2 mg IV at 2-3 minute intervals, until desired degree of reversal.  . ropivacaine (PF) 2 mg/mL (0.2%) (NAROPIN) injection 2 mL  . iopamidol (ISOVUE-M) 41 % intrathecal injection 10 mL  . sodium chloride flush (NS) 0.9 % injection 2 mL  . dexamethasone (DECADRON) injection 10 mg  . lidocaine (PF) (XYLOCAINE) 1 % injection 10 mL   Medications administered: We administered lactated ringers, fentaNYL, ropivacaine (PF) 2 mg/mL (0.2%), iopamidol, sodium chloride flush, and dexamethasone.  See the medical record for exact dosing, route, and time of administration.  New Prescriptions   No medications on file   Primary Care Physician: Cletis Athens, MD Location: [facility] Note by: Gillis Santa, MD Date: 10/13/2016; Time: 4:11 PM  Disclaimer:  Medicine is not an exact science. The  only guarantee in medicine is that nothing is guaranteed. It is important to note that the decision to proceed with this intervention was based on the information collected from the Sandra Dudley. The Data and conclusions were drawn from the Sandra Dudley's questionnaire, the interview, and the physical examination. Because the information was provided in large part by the Sandra Dudley, it cannot be guaranteed that it has not been purposely or unconsciously manipulated. Every effort has been made to obtain as much relevant data as possible for this evaluation. It is important to note that the conclusions that lead to this procedure are derived in large part from the available data. Always take into account that the treatment will also be dependent on availability of resources and existing treatment guidelines, considered by other Pain Management Practitioners as being common knowledge and practice, at the time of the intervention. For Medico-Legal purposes, it is also important to point out that variation in procedural techniques and pharmacological choices are the acceptable norm. The indications, contraindications, technique, and results of the above procedure should only be interpreted and judged by a Board-Certified Interventional Pain Specialist with extensive familiarity and expertise in the same exact procedure and technique.

## 2016-10-13 NOTE — Progress Notes (Signed)
Safety precautions to be maintained throughout the outpatient stay will include: orient to surroundings, keep bed in low position, maintain call bell within reach at all times, provide assistance with transfer out of bed and ambulation.  

## 2016-10-13 NOTE — Patient Instructions (Signed)
Epidural Steroid Injection Patient Information  Description: The epidural space surrounds the nerves as they exit the spinal cord.  In some patients, the nerves can be compressed and inflamed by a bulging disc or a tight spinal canal (spinal stenosis).  By injecting steroids into the epidural space, we can bring irritated nerves into direct contact with a potentially helpful medication.  These steroids act directly on the irritated nerves and can reduce swelling and inflammation which often leads to decreased pain.  Epidural steroids may be injected anywhere along the spine and from the neck to the low back depending upon the location of your pain.   After numbing the skin with local anesthetic (like Novocaine), a small needle is passed into the epidural space slowly.  You may experience a sensation of pressure while this is being done.  The entire block usually last less than 10 minutes.  Conditions which may be treated by epidural steroids:   Low back and leg pain  Neck and arm pain  Spinal stenosis  Post-laminectomy syndrome  Herpes zoster (shingles) pain  Pain from compression fractures  Preparation for the injection:  1. Do not eat any solid food or dairy products within 8 hours of your appointment.  2. You may drink clear liquids up to 3 hours before appointment.  Clear liquids include water, black coffee, juice or soda.  No milk or cream please. 3. You may take your regular medication, including pain medications, with a sip of water before your appointment  Diabetics should hold regular insulin (if taken separately) and take 1/2 normal NPH dos the morning of the procedure.  Carry some sugar containing items with you to your appointment. 4. A driver must accompany you and be prepared to drive you home after your procedure.  5. Bring all your current medications with your. 6. An IV may be inserted and sedation may be given at the discretion of the physician.   7. A blood pressure  cuff, EKG and other monitors will often be applied during the procedure.  Some patients may need to have extra oxygen administered for a short period. 8. You will be asked to provide medical information, including your allergies, prior to the procedure.  We must know immediately if you are taking blood thinners (like Coumadin/Warfarin)  Or if you are allergic to IV iodine contrast (dye). We must know if you could possible be pregnant.  Possible side-effects:  Bleeding from needle site  Infection (rare, may require surgery)  Nerve injury (rare)  Numbness & tingling (temporary)  Difficulty urinating (rare, temporary)  Spinal headache ( a headache worse with upright posture)  Light -headedness (temporary)  Pain at injection site (several days)  Decreased blood pressure (temporary)  Weakness in arm/leg (temporary)  Pressure sensation in back/neck (temporary)  Call if you experience:  Fever/chills associated with headache or increased back/neck pain.  Headache worsened by an upright position.  New onset weakness or numbness of an extremity below the injection site  Hives or difficulty breathing (go to the emergency room)  Inflammation or drainage at the infection site  Severe back/neck pain  Any new symptoms which are concerning to you  Please note:  Although the local anesthetic injected can often make your back or neck feel good for several hours after the injection, the pain will likely return.  It takes 3-7 days for steroids to work in the epidural space.  You may not notice any pain relief for at least that one week.    If effective, we will often do a series of three injections spaced 3-6 weeks apart to maximally decrease your pain.  After the initial series, we generally will wait several months before considering a repeat injection of the same type.  If you have any questions, please call [phone] [facility] Clinic[facility] RISKS AND  COMPLICATIONS  What are the risk, side effects and possible complications? Generally speaking, most procedures are safe.  However, with any procedure there are risks, side effects, and the possibility of complications.  The risks and complications are dependent upon the sites that are lesioned, or the type of nerve block to be performed.  The closer the procedure is to the spine, the more serious the risks are.  Great care is taken when placing the radio frequency needles, block needles or lesioning probes, but sometimes complications can occur. 1. Infection: Any time there is an injection through the skin, there is a risk of infection.  This is why sterile conditions are used for these blocks.  There are four possible types of infection. 1. Localized skin infection. 2. Central Nervous System Infection-This can be in the form of Meningitis, which can be deadly. 3. Epidural Infections-This can be in the form of an epidural abscess, which can cause pressure inside of the spine, causing compression of the spinal cord with subsequent paralysis. This would require an emergency surgery to decompress, and there are no guarantees that the patient would recover from the paralysis. 4. Discitis-This is an infection of the intervertebral discs.  It occurs in about 1% of discography procedures.  It is difficult to treat and it may lead to surgery.        2. Pain: the needles have to go through skin and soft tissues, will cause soreness.       3. Damage to internal structures:  The nerves to be lesioned may be near blood vessels or    other nerves which can be potentially damaged.       4. Bleeding: Bleeding is more common if the patient is taking blood thinners such as  aspirin, Coumadin, Ticiid, Plavix, etc., or if he/she have some genetic predisposition  such as hemophilia. Bleeding into the spinal canal can cause compression of the spinal  cord with subsequent paralysis.  This would require an emergency surgery  to  decompress and there are no guarantees that the patient would recover from the  paralysis.       5. Pneumothorax:  Puncturing of a lung is a possibility, every time a needle is introduced in  the area of the chest or upper back.  Pneumothorax refers to free air around the  collapsed lung(s), inside of the thoracic cavity (chest cavity).  Another two possible  complications related to a similar event would include: Hemothorax and Chylothorax.   These are variations of the Pneumothorax, where instead of air around the collapsed  lung(s), you may have blood or chyle, respectively.       6. Spinal headaches: They may occur with any procedures in the area of the spine.       7. Persistent CSF (Cerebro-Spinal Fluid) leakage: This is a rare problem, but may occur  with prolonged intrathecal or epidural catheters either due to the formation of a fistulous  track or a dural tear.       8. Nerve damage: By working so close to the spinal cord, there is always a possibility of  nerve damage, which could be   as serious as a permanent spinal cord injury with  paralysis.       9. Death:  Although rare, severe deadly allergic reactions known as "Anaphylactic  reaction" can occur to any of the medications used.      10. Worsening of the symptoms:  We can always make thing worse.  What are the chances of something like this happening? Chances of any of this occuring are extremely low.  By statistics, you have more of a chance of getting killed in a motor vehicle accident: while driving to the hospital than any of the above occurring .  Nevertheless, you should be aware that they are possibilities.  In general, it is similar to taking a shower.  Everybody knows that you can slip, hit your head and get killed.  Does that mean that you should not shower again?  Nevertheless always keep in mind that statistics do not mean anything if you happen to be on the wrong side of them.  Even if a procedure has a 1 (one) in a 1,000,000  (million) chance of going wrong, it you happen to be that one..Also, keep in mind that by statistics, you have more of a chance of having something go wrong when taking medications.  Who should not have this procedure? If you are on a blood thinning medication (e.g. Coumadin, Plavix, see list of "Blood Thinners"), or if you have an active infection going on, you should not have the procedure.  If you are taking any blood thinners, please inform your physician.  How should I prepare for this procedure?  Do not eat or drink anything at least six hours prior to the procedure.  Bring a driver with you .  It cannot be a taxi.  Come accompanied by an adult that can drive you back, and that is strong enough to help you if your legs get weak or numb from the local anesthetic.  Take all of your medicines the morning of the procedure with just enough water to swallow them.  If you have diabetes, make sure that you are scheduled to have your procedure done first thing in the morning, whenever possible.  If you have diabetes, take only half of your insulin dose and notify our nurse that you have done so as soon as you arrive at the clinic.  If you are diabetic, but only take blood sugar pills (oral hypoglycemic), then do not take them on the morning of your procedure.  You may take them after you have had the procedure.  Do not take aspirin or any aspirin-containing medications, at least eleven (11) days prior to the procedure.  They may prolong bleeding.  Wear loose fitting clothing that may be easy to take off and that you would not mind if it got stained with Betadine or blood.  Do not wear any jewelry or perfume  Remove any nail coloring.  It will interfere with some of our monitoring equipment.  NOTE: Remember that this is not meant to be interpreted as a complete list of all possible complications.  Unforeseen problems may occur.  BLOOD THINNERS The following drugs contain aspirin or other  products, which can cause increased bleeding during surgery and should not be taken for 2 weeks prior to and 1 week after surgery.  If you should need take something for relief of minor pain, you may take acetaminophen which is found in Tylenol,m Datril, Anacin-3 and Panadol. It is not blood thinner. The products listed below   are.  Do not take any of the products listed below in addition to any listed on your instruction sheet.  A.P.C or A.P.C with Codeine Codeine Phosphate Capsules #3 Ibuprofen Ridaura  ABC compound Congesprin Imuran rimadil  Advil Cope Indocin Robaxisal  Alka-Seltzer Effervescent Pain Reliever and Antacid Coricidin or Coricidin-D  Indomethacin Rufen  Alka-Seltzer plus Cold Medicine Cosprin Ketoprofen S-A-C Tablets  Anacin Analgesic Tablets or Capsules Coumadin Korlgesic Salflex  Anacin Extra Strength Analgesic tablets or capsules CP-2 Tablets Lanoril Salicylate  Anaprox Cuprimine Capsules Levenox Salocol  Anexsia-D Dalteparin Magan Salsalate  Anodynos Darvon compound Magnesium Salicylate Sine-off  Ansaid Dasin Capsules Magsal Sodium Salicylate  Anturane Depen Capsules Marnal Soma  APF Arthritis pain formula Dewitt's Pills Measurin Stanback  Argesic Dia-Gesic Meclofenamic Sulfinpyrazone  Arthritis Bayer Timed Release Aspirin Diclofenac Meclomen Sulindac  Arthritis pain formula Anacin Dicumarol Medipren Supac  Analgesic (Safety coated) Arthralgen Diffunasal Mefanamic Suprofen  Arthritis Strength Bufferin Dihydrocodeine Mepro Compound Suprol  Arthropan liquid Dopirydamole Methcarbomol with Aspirin Synalgos  ASA tablets/Enseals Disalcid Micrainin Tagament  Ascriptin Doan's Midol Talwin  Ascriptin A/D Dolene Mobidin Tanderil  Ascriptin Extra Strength Dolobid Moblgesic Ticlid  Ascriptin with Codeine Doloprin or Doloprin with Codeine Momentum Tolectin  Asperbuf Duoprin Mono-gesic Trendar  Aspergum Duradyne Motrin or Motrin IB Triminicin  Aspirin plain, buffered or enteric  coated Durasal Myochrisine Trigesic  Aspirin Suppositories Easprin Nalfon Trillsate  Aspirin with Codeine Ecotrin Regular or Extra Strength Naprosyn Uracel  Atromid-S Efficin Naproxen Ursinus  Auranofin Capsules Elmiron Neocylate Vanquish  Axotal Emagrin Norgesic Verin  Azathioprine Empirin or Empirin with Codeine Normiflo Vitamin E  Azolid Emprazil Nuprin Voltaren  Bayer Aspirin plain, buffered or children's or timed BC Tablets or powders Encaprin Orgaran Warfarin Sodium  Buff-a-Comp Enoxaparin Orudis Zorpin  Buff-a-Comp with Codeine Equegesic Os-Cal-Gesic   Buffaprin Excedrin plain, buffered or Extra Strength Oxalid   Bufferin Arthritis Strength Feldene Oxphenbutazone   Bufferin plain or Extra Strength Feldene Capsules Oxycodone with Aspirin   Bufferin with Codeine Fenoprofen Fenoprofen Pabalate or Pabalate-SF   Buffets II Flogesic Panagesic   Buffinol plain or Extra Strength Florinal or Florinal with Codeine Panwarfarin   Buf-Tabs Flurbiprofen Penicillamine   Butalbital Compound Four-way cold tablets Penicillin   Butazolidin Fragmin Pepto-Bismol   Carbenicillin Geminisyn Percodan   Carna Arthritis Reliever Geopen Persantine   Carprofen Gold's salt Persistin   Chloramphenicol Goody's Phenylbutazone   Chloromycetin Haltrain Piroxlcam   Clmetidine heparin Plaquenil   Cllnoril Hyco-pap Ponstel   Clofibrate Hydroxy chloroquine Propoxyphen         Before stopping any of these medications, be sure to consult the physician who ordered them.  Some, such as Coumadin (Warfarin) are ordered to prevent or treat serious conditions such as "deep thrombosis", "pumonary embolisms", and other heart problems.  The amount of time that you may need off of the medication may also vary with the medication and the reason for which you were taking it.  If you are taking any of these medications, please make sure you notify your pain physician before you undergo any procedures.         Pain  Management Discharge Instructions  General Discharge Instructions :  If you need to reach your doctor call: Monday-Friday 8:00 am - 4:00 pm at [phone] or toll free [phone].  After clinic hours [phone] to have operator reach doctor.  Bring all of your medication bottles to all your appointments in the pain clinic.  To cancel or reschedule your appointment with Pain Management please remember  to call 24 hours in advance to avoid a fee.  Refer to the educational materials which you have been given on: General Risks, I had my Procedure. Discharge Instructions, Post Sedation.  Post Procedure Instructions:  The drugs you were given will stay in your system until tomorrow, so for the next 24 hours you should not drive, make any legal decisions or drink any alcoholic beverages.  You may eat anything you prefer, but it is better to start with liquids then soups and crackers, and gradually work up to solid foods.  Please notify your doctor immediately if you have any unusual bleeding, trouble breathing or pain that is not related to your normal pain.  Depending on the type of procedure that was done, some parts of your body may feel week and/or numb.  This usually clears up by tonight or the next day.  Walk with the use of an assistive device or accompanied by an adult for the 24 hours.  You may use ice on the affected area for the first 24 hours.  Put ice in a Ziploc bag and cover with a towel and place against area 15 minutes on 15 minutes off.  You may switch to heat after 24 hours.

## 2016-10-28 ENCOUNTER — Ambulatory Visit
Payer: Medicare HMO | Attending: Student in an Organized Health Care Education/Training Program | Admitting: Student in an Organized Health Care Education/Training Program

## 2016-11-04 ENCOUNTER — Other Ambulatory Visit: Payer: Self-pay | Admitting: Internal Medicine

## 2016-11-04 DIAGNOSIS — Z8543 Personal history of malignant neoplasm of ovary: Secondary | ICD-10-CM

## 2016-11-04 DIAGNOSIS — R109 Unspecified abdominal pain: Secondary | ICD-10-CM | POA: Diagnosis not present

## 2016-11-04 DIAGNOSIS — R1032 Left lower quadrant pain: Secondary | ICD-10-CM

## 2016-11-04 DIAGNOSIS — K219 Gastro-esophageal reflux disease without esophagitis: Secondary | ICD-10-CM | POA: Diagnosis not present

## 2016-11-04 DIAGNOSIS — R3989 Other symptoms and signs involving the genitourinary system: Secondary | ICD-10-CM | POA: Diagnosis not present

## 2016-11-04 DIAGNOSIS — I119 Hypertensive heart disease without heart failure: Secondary | ICD-10-CM | POA: Diagnosis not present

## 2016-11-04 DIAGNOSIS — Z23 Encounter for immunization: Secondary | ICD-10-CM | POA: Diagnosis not present

## 2016-11-08 ENCOUNTER — Ambulatory Visit
Admission: RE | Admit: 2016-11-08 | Discharge: 2016-11-08 | Disposition: A | Discharge status: Home / Self Care | Payer: Medicare HMO | Source: Ambulatory Visit | Attending: Internal Medicine | Admitting: Internal Medicine

## 2016-11-08 DIAGNOSIS — Z8041 Family history of malignant neoplasm of ovary: Secondary | ICD-10-CM | POA: Insufficient documentation

## 2016-11-08 DIAGNOSIS — Z90710 Acquired absence of both cervix and uterus: Secondary | ICD-10-CM | POA: Insufficient documentation

## 2016-11-08 DIAGNOSIS — R1032 Left lower quadrant pain: Secondary | ICD-10-CM

## 2016-11-08 DIAGNOSIS — Z8543 Personal history of malignant neoplasm of ovary: Secondary | ICD-10-CM

## 2016-11-19 DIAGNOSIS — K450 Other specified abdominal hernia with obstruction, without gangrene: Secondary | ICD-10-CM | POA: Diagnosis not present

## 2016-11-19 DIAGNOSIS — K5732 Diverticulitis of large intestine without perforation or abscess without bleeding: Secondary | ICD-10-CM | POA: Diagnosis not present

## 2016-11-19 DIAGNOSIS — M76891 Other specified enthesopathies of right lower limb, excluding foot: Secondary | ICD-10-CM | POA: Diagnosis not present

## 2016-11-19 DIAGNOSIS — I10 Essential (primary) hypertension: Secondary | ICD-10-CM | POA: Diagnosis not present

## 2017-02-22 ENCOUNTER — Encounter: Payer: Self-pay | Admitting: Emergency Medicine

## 2017-02-22 ENCOUNTER — Telehealth: Payer: Self-pay | Admitting: Family Medicine

## 2017-02-22 ENCOUNTER — Ambulatory Visit
Admission: RE | Admit: 2017-02-22 | Discharge: 2017-02-22 | Disposition: A | Discharge status: Home / Self Care | Payer: Medicare HMO | Source: Ambulatory Visit | Attending: Family Medicine | Admitting: Family Medicine

## 2017-02-22 ENCOUNTER — Ambulatory Visit
Admission: EM | Admit: 2017-02-22 | Discharge: 2017-02-22 | Disposition: A | Discharge status: Home / Self Care | Payer: Medicare HMO | Attending: Family Medicine | Admitting: Family Medicine

## 2017-02-22 ENCOUNTER — Other Ambulatory Visit: Payer: Self-pay

## 2017-02-22 DIAGNOSIS — I700 Atherosclerosis of aorta: Secondary | ICD-10-CM | POA: Insufficient documentation

## 2017-02-22 DIAGNOSIS — K760 Fatty (change of) liver, not elsewhere classified: Secondary | ICD-10-CM | POA: Diagnosis not present

## 2017-02-22 DIAGNOSIS — R1030 Lower abdominal pain, unspecified: Secondary | ICD-10-CM | POA: Diagnosis not present

## 2017-02-22 DIAGNOSIS — K5732 Diverticulitis of large intestine without perforation or abscess without bleeding: Secondary | ICD-10-CM | POA: Diagnosis not present

## 2017-02-22 DIAGNOSIS — R197 Diarrhea, unspecified: Secondary | ICD-10-CM | POA: Diagnosis not present

## 2017-02-22 DIAGNOSIS — R109 Unspecified abdominal pain: Secondary | ICD-10-CM | POA: Diagnosis not present

## 2017-02-22 HISTORY — DX: Malignant (primary) neoplasm, unspecified: C80.1

## 2017-02-22 LAB — COMPREHENSIVE METABOLIC PANEL
ALBUMIN: 4.1 g/dL (ref 3.5–5.0)
ALT: 40 U/L (ref 14–54)
ANION GAP: 8 (ref 5–15)
AST: 51 U/L — ABNORMAL HIGH (ref 15–41)
Alkaline Phosphatase: 54 U/L (ref 38–126)
BUN: 18 mg/dL (ref 6–20)
CHLORIDE: 104 mmol/L (ref 101–111)
CO2: 26 mmol/L (ref 22–32)
Calcium: 8.9 mg/dL (ref 8.9–10.3)
Creatinine, Ser: 0.83 mg/dL (ref 0.44–1.00)
GFR calc non Af Amer: >60 mL/min (ref >60)
GLUCOSE: 121 mg/dL — AB (ref 65–99)
Potassium: 4 mmol/L (ref 3.5–5.1)
SODIUM: 138 mmol/L (ref 135–145)
Total Bilirubin: 0.4 mg/dL (ref 0.3–1.2)
Total Protein: 7.8 g/dL (ref 6.5–8.1)

## 2017-02-22 LAB — URINALYSIS, COMPLETE (UACMP) WITH MICROSCOPIC
BILIRUBIN URINE: NEGATIVE
Glucose, UA: NEGATIVE mg/dL
Hgb urine dipstick: NEGATIVE
Ketones, ur: NEGATIVE mg/dL
LEUKOCYTES UA: NEGATIVE
NITRITE: NEGATIVE
PH: 6 (ref 5.0–8.0)
PROTEIN: NEGATIVE mg/dL
Specific Gravity, Urine: 1.02 (ref 1.005–1.030)

## 2017-02-22 LAB — CBC WITH DIFFERENTIAL/PLATELET
BASOS ABS: 0.1 10*3/uL (ref 0–0.1)
BASOS PCT: 1 %
EOS ABS: 0.3 10*3/uL (ref 0–0.7)
Eosinophils Relative: 5 %
HCT: 35.7 % (ref 35.0–47.0)
Hemoglobin: 12.2 g/dL (ref 12.0–16.0)
Lymphocytes Relative: 36 %
Lymphs Abs: 2.2 10*3/uL (ref 1.0–3.6)
MCH: 30.8 pg (ref 26.0–34.0)
MCHC: 34.3 g/dL (ref 32.0–36.0)
MCV: 89.9 fL (ref 80.0–100.0)
MONO ABS: 0.5 10*3/uL (ref 0.2–0.9)
MONOS PCT: 8 %
NEUTROS ABS: 3.1 10*3/uL (ref 1.4–6.5)
NEUTROS PCT: 50 %
Platelets: 220 10*3/uL (ref 150–440)
RBC: 3.97 MIL/uL (ref 3.80–5.20)
RDW: 13.4 % (ref 11.5–14.5)
WBC: 6.1 10*3/uL (ref 3.6–11.0)

## 2017-02-22 MED ORDER — METRONIDAZOLE 500 MG PO TABS: 500.0000 mg | ORAL_TABLET | Freq: Three times a day (TID) | ORAL | 0 refills | Status: DC

## 2017-02-22 MED ORDER — CIPROFLOXACIN HCL 500 MG PO TABS: 500.0000 mg | ORAL_TABLET | Freq: Two times a day (BID) | ORAL | 0 refills | Status: DC

## 2017-02-22 MED ORDER — IOPAMIDOL (ISOVUE-300) INJECTION 61%
100.0000 mL | Freq: Once | INTRAVENOUS | Status: AC | PRN
  Administered 2017-02-22: 100 mL via INTRAVENOUS

## 2017-02-22 NOTE — ED Triage Notes (Signed)
Patient states that after she ate at [facility] on Saturday she started having abdominal pain.  Patient denies N/V.  Patient reports diarrhea.

## 2017-02-22 NOTE — Telephone Encounter (Signed)
CT with diverticulitis.  Treating with Cipro/flagyl.   [facility]

## 2017-02-22 NOTE — ED Provider Notes (Signed)
[facility]    CSN: [id number] Arrival date & time: 02/22/17  4580  History   Chief Complaint Chief Complaint  Patient presents with  . Abdominal Pain   HPI  67 year old female presents with abdominal pain.  Abdominal pain  Patient reports that she developed abdominal pain on Saturday after eating [employer].  Patient reports that she has had bilateral lower abdominal pain since that time.  Pain was initially severe and is mild currently.  Patient states that she had associated diarrhea but this has now resolved.  No nausea vomiting.  No hematochezia or melena.  Patient is very concerned about her abdominal pain.  No medications or interventions tried.  No known exacerbating relieving factors.  No other complaints at this time.  Of note, patient states that she had difficulty emptying her bladder when this started but this is now resolved.  She denies any dysuria or urinary symptoms.  Past Medical History:  Diagnosis Date  . Anxiety   . Arthritis   . Chronic pain   . Enlarged heart   . High cholesterol   . Hypertension    Past Surgical History:  Procedure Laterality Date  . BACK SURGERY    . KNEE SURGERY    . SHOULDER SURGERY    Hysterectomy Cholecystectomy Hernia repair  OB History    No data available     Home Medications    Prior to Admission medications   Medication Sig Start Date End Date Taking? Authorizing Provider  albuterol (PROVENTIL HFA;VENTOLIN HFA) 108 (90 Base) MCG/ACT inhaler Inhale 1 puff into the lungs every 6 (six) hours as needed for wheezing or shortness of breath.   Yes [provider]  ALPRAZolam (XANAX) 0.25 MG tablet Take 0.25 mg by mouth at bedtime as needed for anxiety.   Yes [provider]  budesonide-formoterol (SYMBICORT) 80-4.5 MCG/ACT inhaler Inhale 2 puffs into the lungs 2 (two) times daily.   Yes [provider]  fenofibrate (TRICOR) 145 MG tablet Take 145 mg by mouth  daily.   Yes [provider]  gabapentin (NEURONTIN) 100 MG capsule Take 100 mg by mouth 2 (two) times daily.   Yes [provider]  lisinopril-hydrochlorothiazide (PRINZIDE,ZESTORETIC) 20-12.5 MG tablet Take 1 tablet by mouth daily.   Yes [provider]  omeprazole (PRILOSEC) 40 MG capsule Take 40 mg by mouth daily.   Yes [provider]  primidone (MYSOLINE) 50 MG tablet Take 50 mg by mouth 2 (two) times daily.   Yes [provider]  traMADol (ULTRAM) 50 MG tablet Take by mouth every 6 (six) hours as needed.   Yes [provider]  venlafaxine (EFFEXOR) 75 MG tablet Take 75 mg by mouth 2 (two) times daily.   Yes [provider]  Camphor-Menthol-Methyl Sal (HM SALONPAS PAIN RELIEF) 1.2-5.7-6.3 % PTCH Apply 1 patch topically 3 (three) times daily. Leave in place for up to 12 hours. Patient not taking: Reported on 09/29/2016 05/28/16   Melynda Ripple, MD  diclofenac (VOLTAREN) 50 MG EC tablet Take 1 tablet (50 mg total) by mouth 2 (two) times daily. Patient not taking: Reported on 09/29/2016 06/02/16   Menshew, Dannielle Karvonen, PA-C  ibuprofen (ADVIL,MOTRIN) 600 MG tablet Take 1 tablet (600 mg total) by mouth every 6 (six) hours as needed. Patient not taking: Reported on 09/29/2016 05/28/16   Melynda Ripple, MD    Family History Family History  Problem Relation Age of Onset  . Hypertension Mother   .  Cancer Father     Social History Social History   Tobacco Use  . Smoking status: Never Smoker  . Smokeless tobacco: Never Used  Substance Use Topics  . Alcohol use: No  . Drug use: No     Allergies   Codeine and Penicillins   Review of Systems Review of Systems  Constitutional: Negative.   Gastrointestinal: Positive for abdominal pain and diarrhea. Negative for nausea and vomiting.  Genitourinary:       No current GU symptoms.   Physical Exam Triage Vital Signs ED Triage Vitals  Enc Vitals Group     BP 02/22/17 0847  121/70     Pulse Rate 02/22/17 0847 78     Resp 02/22/17 0847 16     Temp 02/22/17 0847 97.6 F (36.4 C)     Temp Source 02/22/17 0847 Oral     SpO2 02/22/17 0847 98 %     Weight 02/22/17 0843 220 lb (99.8 kg)     Height 02/22/17 0843 5\' 5"  (1.651 m)     Head Circumference --      Peak Flow --      Pain Score 02/22/17 0843 2     Pain Loc --      Pain Edu? --      Excl. in [place]? --    Updated Vital Signs BP 121/70 (BP Location: Left Arm)   Pulse 78   Temp 97.6 F (36.4 C) (Oral)   Resp 16   Ht 5\' 5"  (1.651 m)   Wt 220 lb (99.8 kg)   SpO2 98%   BMI 36.61 kg/m      Physical Exam  Constitutional: She is oriented to person, place, and time. She appears well-developed and well-nourished. No distress.  HENT:  Head: Normocephalic and atraumatic.  Eyes: Conjunctivae are normal. No scleral icterus.  Cardiovascular: Normal rate and regular rhythm.  Pulmonary/Chest: Effort normal and breath sounds normal. She has no wheezes. She has no rales.  Abdominal: Soft. She exhibits no distension.  Tender to palpation in suprapubic region and left lower quadrant.  No rebound or guarding.  No appreciable mass.  Neurological: She is alert and oriented to person, place, and time.  Psychiatric: She has a normal mood and affect. Her behavior is normal.  Nursing note and vitals reviewed.  UC Treatments / Results  Labs (all labs ordered are listed, but only abnormal results are displayed) Labs Reviewed  URINALYSIS, COMPLETE (UACMP) WITH MICROSCOPIC - Abnormal; Notable for the following components:      Result Value   Squamous Epithelial / LPF 0-5 (*)    Bacteria, UA RARE (*)    All other components within normal limits  COMPREHENSIVE METABOLIC PANEL - Abnormal; Notable for the following components:   Glucose, Bld 121 (*)    AST 51 (*)    All other components within normal limits  CBC WITH DIFFERENTIAL/PLATELET    EKG  EKG Interpretation None       Radiology No results  found.  Procedures Procedures (including critical care time)  Medications Ordered in UC Medications - No data to display   Initial Impression / Assessment and Plan / UC Course  I have reviewed the triage vital signs and the nursing notes.  Pertinent labs & imaging results that were available during my care of the patient were reviewed by me and considered in my medical decision making (see chart for details).    67 year old female presents with lower abdominal pain.  Urinalysis  negative.  Etiology unclear at this time.  No red flags.  Patient very concerned about her pain.  I informed her that the etiology remains unclear and that I recommended further evaluation with CT imaging.  Patient in agreement. Labs unremarkable. Will send for CT.  Final Clinical Impressions(s) / UC Diagnoses   Final diagnoses:  Lower abdominal pain    ED Discharge Orders        Ordered    CT ABDOMEN PELVIS W CONTRAST     02/22/17 0949     Controlled Substance Prescriptions [place] Controlled Substance Registry consulted? Not Applicable   Coral Spikes, DO 02/22/17 1020

## 2017-02-22 NOTE — Discharge Instructions (Signed)
Labs unremarkable.  We will call with the CT results.  Take care  Dr. Lacinda Axon

## 2017-02-22 NOTE — ED Notes (Signed)
CT Authorization obtained from [facility]. Authorization #[id number] valid 02/22/17-03/24/17.

## 2017-04-04 DIAGNOSIS — J208 Acute bronchitis due to other specified organisms: Secondary | ICD-10-CM | POA: Diagnosis not present

## 2017-04-04 DIAGNOSIS — E669 Obesity, unspecified: Secondary | ICD-10-CM | POA: Diagnosis not present

## 2017-04-04 DIAGNOSIS — I10 Essential (primary) hypertension: Secondary | ICD-10-CM | POA: Diagnosis not present

## 2017-04-04 DIAGNOSIS — R5381 Other malaise: Secondary | ICD-10-CM | POA: Diagnosis not present

## 2017-04-04 DIAGNOSIS — J449 Chronic obstructive pulmonary disease, unspecified: Secondary | ICD-10-CM | POA: Diagnosis not present

## 2017-07-04 DIAGNOSIS — D649 Anemia, unspecified: Secondary | ICD-10-CM | POA: Diagnosis not present

## 2017-07-04 DIAGNOSIS — M792 Neuralgia and neuritis, unspecified: Secondary | ICD-10-CM | POA: Diagnosis not present

## 2017-07-04 DIAGNOSIS — M76891 Other specified enthesopathies of right lower limb, excluding foot: Secondary | ICD-10-CM | POA: Diagnosis not present

## 2017-07-04 DIAGNOSIS — E669 Obesity, unspecified: Secondary | ICD-10-CM | POA: Diagnosis not present

## 2017-07-15 DIAGNOSIS — M76891 Other specified enthesopathies of right lower limb, excluding foot: Secondary | ICD-10-CM | POA: Diagnosis not present

## 2017-07-15 DIAGNOSIS — K450 Other specified abdominal hernia with obstruction, without gangrene: Secondary | ICD-10-CM | POA: Diagnosis not present

## 2017-07-15 DIAGNOSIS — I10 Essential (primary) hypertension: Secondary | ICD-10-CM | POA: Diagnosis not present

## 2017-07-15 DIAGNOSIS — I119 Hypertensive heart disease without heart failure: Secondary | ICD-10-CM | POA: Diagnosis not present

## 2017-07-18 DIAGNOSIS — E034 Atrophy of thyroid (acquired): Secondary | ICD-10-CM | POA: Diagnosis not present

## 2017-07-18 DIAGNOSIS — E119 Type 2 diabetes mellitus without complications: Secondary | ICD-10-CM | POA: Diagnosis not present

## 2017-07-18 DIAGNOSIS — I10 Essential (primary) hypertension: Secondary | ICD-10-CM | POA: Diagnosis not present

## 2017-07-18 DIAGNOSIS — R5381 Other malaise: Secondary | ICD-10-CM | POA: Diagnosis not present

## 2017-10-04 ENCOUNTER — Other Ambulatory Visit: Payer: Self-pay | Admitting: Internal Medicine

## 2017-10-04 DIAGNOSIS — I119 Hypertensive heart disease without heart failure: Secondary | ICD-10-CM | POA: Diagnosis not present

## 2017-10-04 DIAGNOSIS — K219 Gastro-esophageal reflux disease without esophagitis: Secondary | ICD-10-CM | POA: Diagnosis not present

## 2017-10-04 DIAGNOSIS — S43429A Sprain of unspecified rotator cuff capsule, initial encounter: Secondary | ICD-10-CM | POA: Diagnosis not present

## 2017-10-04 DIAGNOSIS — R109 Unspecified abdominal pain: Secondary | ICD-10-CM

## 2017-10-04 DIAGNOSIS — M76891 Other specified enthesopathies of right lower limb, excluding foot: Secondary | ICD-10-CM | POA: Diagnosis not present

## 2017-10-06 ENCOUNTER — Other Ambulatory Visit: Payer: Self-pay | Admitting: Internal Medicine

## 2017-10-07 ENCOUNTER — Ambulatory Visit
Admission: RE | Admit: 2017-10-07 | Discharge: 2017-10-07 | Disposition: A | Discharge status: Home / Self Care | Payer: Medicare HMO | Source: Ambulatory Visit | Attending: Internal Medicine | Admitting: Internal Medicine

## 2017-10-07 DIAGNOSIS — R109 Unspecified abdominal pain: Secondary | ICD-10-CM

## 2017-10-07 DIAGNOSIS — K760 Fatty (change of) liver, not elsewhere classified: Secondary | ICD-10-CM | POA: Diagnosis not present

## 2017-10-27 DIAGNOSIS — K219 Gastro-esophageal reflux disease without esophagitis: Secondary | ICD-10-CM | POA: Diagnosis not present

## 2017-10-27 DIAGNOSIS — M76891 Other specified enthesopathies of right lower limb, excluding foot: Secondary | ICD-10-CM | POA: Diagnosis not present

## 2017-10-27 DIAGNOSIS — L570 Actinic keratosis: Secondary | ICD-10-CM | POA: Diagnosis not present

## 2017-10-27 DIAGNOSIS — I119 Hypertensive heart disease without heart failure: Secondary | ICD-10-CM | POA: Diagnosis not present

## 2017-12-29 DIAGNOSIS — M542 Cervicalgia: Secondary | ICD-10-CM | POA: Diagnosis not present

## 2017-12-29 DIAGNOSIS — I6529 Occlusion and stenosis of unspecified carotid artery: Secondary | ICD-10-CM | POA: Diagnosis not present

## 2017-12-29 DIAGNOSIS — I359 Nonrheumatic aortic valve disorder, unspecified: Secondary | ICD-10-CM | POA: Diagnosis not present

## 2017-12-29 DIAGNOSIS — R011 Cardiac murmur, unspecified: Secondary | ICD-10-CM | POA: Diagnosis not present

## 2018-01-03 DIAGNOSIS — M47812 Spondylosis without myelopathy or radiculopathy, cervical region: Secondary | ICD-10-CM | POA: Diagnosis not present

## 2018-01-03 DIAGNOSIS — S43429A Sprain of unspecified rotator cuff capsule, initial encounter: Secondary | ICD-10-CM | POA: Diagnosis not present

## 2018-01-03 DIAGNOSIS — I359 Nonrheumatic aortic valve disorder, unspecified: Secondary | ICD-10-CM | POA: Diagnosis not present

## 2018-01-03 DIAGNOSIS — R011 Cardiac murmur, unspecified: Secondary | ICD-10-CM | POA: Diagnosis not present

## 2018-01-04 DIAGNOSIS — I359 Nonrheumatic aortic valve disorder, unspecified: Secondary | ICD-10-CM | POA: Diagnosis not present

## 2018-01-04 DIAGNOSIS — R011 Cardiac murmur, unspecified: Secondary | ICD-10-CM | POA: Diagnosis not present

## 2018-01-04 DIAGNOSIS — M47812 Spondylosis without myelopathy or radiculopathy, cervical region: Secondary | ICD-10-CM | POA: Diagnosis not present

## 2018-01-04 DIAGNOSIS — R42 Dizziness and giddiness: Secondary | ICD-10-CM | POA: Diagnosis not present

## 2018-02-17 DIAGNOSIS — J209 Acute bronchitis, unspecified: Secondary | ICD-10-CM | POA: Diagnosis not present

## 2018-02-17 DIAGNOSIS — K5732 Diverticulitis of large intestine without perforation or abscess without bleeding: Secondary | ICD-10-CM | POA: Diagnosis not present

## 2018-02-17 DIAGNOSIS — R011 Cardiac murmur, unspecified: Secondary | ICD-10-CM | POA: Diagnosis not present

## 2018-05-11 DIAGNOSIS — M47812 Spondylosis without myelopathy or radiculopathy, cervical region: Secondary | ICD-10-CM | POA: Diagnosis not present

## 2018-05-11 DIAGNOSIS — K5732 Diverticulitis of large intestine without perforation or abscess without bleeding: Secondary | ICD-10-CM | POA: Diagnosis not present

## 2018-05-11 DIAGNOSIS — R011 Cardiac murmur, unspecified: Secondary | ICD-10-CM | POA: Diagnosis not present

## 2018-06-23 ENCOUNTER — Ambulatory Visit
Admission: RE | Admit: 2018-06-23 | Discharge: 2018-06-23 | Disposition: A | Discharge status: Home / Self Care | Payer: Medicare HMO | Source: Ambulatory Visit | Attending: Internal Medicine | Admitting: Internal Medicine

## 2018-06-23 ENCOUNTER — Ambulatory Visit
Admission: RE | Admit: 2018-06-23 | Discharge: 2018-06-23 | Disposition: A | Discharge status: Home / Self Care | Payer: Medicare HMO | Attending: Internal Medicine | Admitting: Internal Medicine

## 2018-06-23 ENCOUNTER — Other Ambulatory Visit: Payer: Self-pay | Admitting: Internal Medicine

## 2018-06-23 DIAGNOSIS — R05 Cough: Secondary | ICD-10-CM

## 2018-06-23 DIAGNOSIS — R011 Cardiac murmur, unspecified: Secondary | ICD-10-CM | POA: Diagnosis not present

## 2018-06-23 DIAGNOSIS — R059 Cough, unspecified: Secondary | ICD-10-CM

## 2018-06-23 DIAGNOSIS — K5732 Diverticulitis of large intestine without perforation or abscess without bleeding: Secondary | ICD-10-CM | POA: Diagnosis not present

## 2018-06-23 DIAGNOSIS — M47812 Spondylosis without myelopathy or radiculopathy, cervical region: Secondary | ICD-10-CM | POA: Diagnosis not present

## 2018-08-15 DIAGNOSIS — M47812 Spondylosis without myelopathy or radiculopathy, cervical region: Secondary | ICD-10-CM | POA: Diagnosis not present

## 2018-08-15 DIAGNOSIS — K5732 Diverticulitis of large intestine without perforation or abscess without bleeding: Secondary | ICD-10-CM | POA: Diagnosis not present

## 2018-08-15 DIAGNOSIS — R011 Cardiac murmur, unspecified: Secondary | ICD-10-CM | POA: Diagnosis not present

## 2018-09-14 DIAGNOSIS — G5793 Unspecified mononeuropathy of bilateral lower limbs: Secondary | ICD-10-CM | POA: Diagnosis not present

## 2018-09-14 DIAGNOSIS — I872 Venous insufficiency (chronic) (peripheral): Secondary | ICD-10-CM | POA: Diagnosis not present

## 2018-09-14 DIAGNOSIS — M79672 Pain in left foot: Secondary | ICD-10-CM | POA: Diagnosis not present

## 2018-09-14 DIAGNOSIS — M545 Low back pain: Secondary | ICD-10-CM | POA: Diagnosis not present

## 2018-09-14 DIAGNOSIS — G8929 Other chronic pain: Secondary | ICD-10-CM | POA: Diagnosis not present

## 2018-09-14 DIAGNOSIS — M7672 Peroneal tendinitis, left leg: Secondary | ICD-10-CM | POA: Diagnosis not present

## 2018-11-01 DIAGNOSIS — R040 Epistaxis: Secondary | ICD-10-CM | POA: Diagnosis not present

## 2018-11-16 DIAGNOSIS — M1990 Unspecified osteoarthritis, unspecified site: Secondary | ICD-10-CM | POA: Diagnosis not present

## 2018-11-16 DIAGNOSIS — I10 Essential (primary) hypertension: Secondary | ICD-10-CM | POA: Diagnosis not present

## 2018-11-16 DIAGNOSIS — R5383 Other fatigue: Secondary | ICD-10-CM | POA: Diagnosis not present

## 2018-11-16 DIAGNOSIS — F419 Anxiety disorder, unspecified: Secondary | ICD-10-CM | POA: Diagnosis not present

## 2018-11-17 DIAGNOSIS — R5383 Other fatigue: Secondary | ICD-10-CM | POA: Diagnosis not present

## 2018-11-28 DIAGNOSIS — E119 Type 2 diabetes mellitus without complications: Secondary | ICD-10-CM | POA: Diagnosis not present

## 2018-11-28 DIAGNOSIS — I10 Essential (primary) hypertension: Secondary | ICD-10-CM | POA: Diagnosis not present

## 2018-11-28 DIAGNOSIS — E034 Atrophy of thyroid (acquired): Secondary | ICD-10-CM | POA: Diagnosis not present

## 2018-11-28 DIAGNOSIS — R5381 Other malaise: Secondary | ICD-10-CM | POA: Diagnosis not present

## 2018-12-14 DIAGNOSIS — M76822 Posterior tibial tendinitis, left leg: Secondary | ICD-10-CM | POA: Diagnosis not present

## 2018-12-14 DIAGNOSIS — M25572 Pain in left ankle and joints of left foot: Secondary | ICD-10-CM | POA: Diagnosis not present

## 2018-12-14 DIAGNOSIS — M7752 Other enthesopathy of left foot: Secondary | ICD-10-CM | POA: Diagnosis not present

## 2018-12-14 DIAGNOSIS — M216X2 Other acquired deformities of left foot: Secondary | ICD-10-CM | POA: Diagnosis not present

## 2018-12-14 DIAGNOSIS — M19071 Primary osteoarthritis, right ankle and foot: Secondary | ICD-10-CM | POA: Diagnosis not present

## 2018-12-14 DIAGNOSIS — M79671 Pain in right foot: Secondary | ICD-10-CM | POA: Diagnosis not present

## 2018-12-14 DIAGNOSIS — M79672 Pain in left foot: Secondary | ICD-10-CM | POA: Diagnosis not present

## 2018-12-14 DIAGNOSIS — M7731 Calcaneal spur, right foot: Secondary | ICD-10-CM | POA: Diagnosis not present

## 2018-12-14 DIAGNOSIS — M7661 Achilles tendinitis, right leg: Secondary | ICD-10-CM | POA: Diagnosis not present

## 2018-12-14 DIAGNOSIS — M2141 Flat foot [pes planus] (acquired), right foot: Secondary | ICD-10-CM | POA: Diagnosis not present

## 2018-12-14 DIAGNOSIS — M216X1 Other acquired deformities of right foot: Secondary | ICD-10-CM | POA: Diagnosis not present

## 2018-12-15 DIAGNOSIS — J449 Chronic obstructive pulmonary disease, unspecified: Secondary | ICD-10-CM | POA: Diagnosis not present

## 2018-12-15 DIAGNOSIS — R5383 Other fatigue: Secondary | ICD-10-CM | POA: Diagnosis not present

## 2018-12-15 DIAGNOSIS — I10 Essential (primary) hypertension: Secondary | ICD-10-CM | POA: Diagnosis not present

## 2018-12-15 DIAGNOSIS — M1990 Unspecified osteoarthritis, unspecified site: Secondary | ICD-10-CM | POA: Diagnosis not present

## 2018-12-15 DIAGNOSIS — F419 Anxiety disorder, unspecified: Secondary | ICD-10-CM | POA: Diagnosis not present

## 2019-04-19 DIAGNOSIS — I10 Essential (primary) hypertension: Secondary | ICD-10-CM | POA: Diagnosis not present

## 2019-04-19 DIAGNOSIS — M1990 Unspecified osteoarthritis, unspecified site: Secondary | ICD-10-CM | POA: Diagnosis not present

## 2019-04-19 DIAGNOSIS — F419 Anxiety disorder, unspecified: Secondary | ICD-10-CM | POA: Diagnosis not present

## 2019-04-19 DIAGNOSIS — R011 Cardiac murmur, unspecified: Secondary | ICD-10-CM | POA: Diagnosis not present

## 2019-05-22 DIAGNOSIS — M19071 Primary osteoarthritis, right ankle and foot: Secondary | ICD-10-CM | POA: Diagnosis not present

## 2019-05-22 DIAGNOSIS — M2141 Flat foot [pes planus] (acquired), right foot: Secondary | ICD-10-CM | POA: Diagnosis not present

## 2019-05-22 DIAGNOSIS — M79672 Pain in left foot: Secondary | ICD-10-CM | POA: Diagnosis not present

## 2019-05-22 DIAGNOSIS — M216X2 Other acquired deformities of left foot: Secondary | ICD-10-CM | POA: Diagnosis not present

## 2019-05-22 DIAGNOSIS — M25572 Pain in left ankle and joints of left foot: Secondary | ICD-10-CM | POA: Diagnosis not present

## 2019-05-22 DIAGNOSIS — M79671 Pain in right foot: Secondary | ICD-10-CM | POA: Diagnosis not present

## 2019-05-22 DIAGNOSIS — M216X1 Other acquired deformities of right foot: Secondary | ICD-10-CM | POA: Diagnosis not present

## 2019-05-22 DIAGNOSIS — M7661 Achilles tendinitis, right leg: Secondary | ICD-10-CM | POA: Diagnosis not present

## 2019-05-22 DIAGNOSIS — M7731 Calcaneal spur, right foot: Secondary | ICD-10-CM | POA: Diagnosis not present

## 2019-07-05 ENCOUNTER — Encounter: Payer: Self-pay | Admitting: Internal Medicine

## 2019-07-05 ENCOUNTER — Ambulatory Visit (INDEPENDENT_AMBULATORY_CARE_PROVIDER_SITE_OTHER): Payer: Medicare HMO | Admitting: Internal Medicine

## 2019-07-05 ENCOUNTER — Encounter (INDEPENDENT_AMBULATORY_CARE_PROVIDER_SITE_OTHER): Payer: Self-pay

## 2019-07-05 ENCOUNTER — Other Ambulatory Visit: Payer: Self-pay

## 2019-07-05 VITALS — BP 110/65 | HR 79 | Wt 219.0 lb

## 2019-07-05 DIAGNOSIS — M545 Low back pain, unspecified: Secondary | ICD-10-CM

## 2019-07-05 DIAGNOSIS — I10 Essential (primary) hypertension: Secondary | ICD-10-CM | POA: Diagnosis not present

## 2019-07-05 DIAGNOSIS — F419 Anxiety disorder, unspecified: Secondary | ICD-10-CM

## 2019-07-05 DIAGNOSIS — R251 Tremor, unspecified: Secondary | ICD-10-CM | POA: Diagnosis not present

## 2019-07-05 DIAGNOSIS — E669 Obesity, unspecified: Secondary | ICD-10-CM | POA: Diagnosis not present

## 2019-07-05 DIAGNOSIS — L309 Dermatitis, unspecified: Secondary | ICD-10-CM | POA: Insufficient documentation

## 2019-07-05 DIAGNOSIS — G8929 Other chronic pain: Secondary | ICD-10-CM | POA: Diagnosis not present

## 2019-07-05 MED ORDER — ALPRAZOLAM 0.25 MG PO TABS: 0.2500 mg | ORAL_TABLET | Freq: Every evening | ORAL | 0 refills | Status: DC | PRN

## 2019-07-05 MED ORDER — LISINOPRIL-HYDROCHLOROTHIAZIDE 20-12.5 MG PO TABS: 1.0000 | ORAL_TABLET | Freq: Every day | ORAL | 3 refills | Status: DC

## 2019-07-05 NOTE — Assessment & Plan Note (Signed)
Blood pressure is stable today. 

## 2019-07-05 NOTE — Assessment & Plan Note (Signed)
It is chronic problem, patient does not have any signs or symptoms of depression.

## 2019-07-05 NOTE — Assessment & Plan Note (Signed)
We will make a referral to dermatology.

## 2019-07-05 NOTE — Assessment & Plan Note (Signed)
Appears that patient has benign essential tremor we will however send her to neurology for evaluation

## 2019-07-05 NOTE — Assessment & Plan Note (Signed)
Lose weight and walk daily.  She has an orthopedic specialist who operated on her he does not think any other surgical work-up will help her.

## 2019-07-05 NOTE — Progress Notes (Signed)
Established Patient Office Visit  Subjective:  Patient ID: Sandra Dudley, female    DOB: [birth year]  Age: 69 y.o. MRN: [id number]  CC:  Chief Complaint  Patient presents with  . Back Pain    patient complains of having back pain x 2 weeks that is radiating into her hips   . Anxiety    patient requesting refill of xanax     Sandra Dudley presents for consultation of her back pain and anxiety.  She points out a Keratosis on her left forearm.    She notes that her back pain radiates across her lower back and down into her right hip. It is a stabbing pain that she rates 8/10.   She notes that she has a really bad shake in her hands. She said it's worse when she is upset. This could be exacerbated by her albuterol inhaler; she is taking this on an as needed basis. Her brother and mother have Parkinson's disease.   She requests a refill on her Xanax BID. She takes it once in the morning and once before she goes to bed. She is agreeable to trying to reduce it to once per day.    Past Medical History:  Diagnosis Date  . Anxiety   . Arthritis   . Cancer ([place])   . Chronic pain   . Enlarged heart   . High cholesterol   . Hypertension     Past Surgical History:  Procedure Laterality Date  . BACK SURGERY    . KNEE SURGERY    . SHOULDER SURGERY      Family History  Problem Relation Age of Onset  . Hypertension Mother   . Cancer Father     Social History   Socioeconomic History  . Marital status: Divorced    Spouse name: Not on file  . Number of children: Not on file  . Years of education: Not on file  . Highest education level: Not on file  Occupational History  . Not on file  Tobacco Use  . Smoking status: Never Smoker  . Smokeless tobacco: Never Used  Substance and Sexual Activity  . Alcohol use: No  . Drug use: No  . Sexual activity: Not on file  Other Topics Concern  . Not on file  Social History Narrative  . Not on file   Social Determinants of  Health   Financial Resource Strain:   . Difficulty of Paying Living Expenses:   Food Insecurity:   . Worried About Charity fundraiser in the Last Year:   . Arboriculturist in the Last Year:   Transportation Needs:   . Film/video editor (Medical):   Marland Kitchen Lack of Transportation (Non-Medical):   Physical Activity:   . Days of Exercise per Week:   . Minutes of Exercise per Session:   Stress:   . Feeling of Stress :   Social Connections:   . Frequency of Communication with Friends and Family:   . Frequency of Social Gatherings with Friends and Family:   . Attends Religious Services:   . Active Member of Clubs or Organizations:   . Attends Archivist Meetings:   Marland Kitchen Marital Status:   Intimate Partner Violence:   . Fear of Current or Ex-Partner:   . Emotionally Abused:   Marland Kitchen Physically Abused:   . Sexually Abused:      Current Outpatient Medications:  .  albuterol (PROVENTIL HFA;VENTOLIN HFA) 108 (90 Base) MCG/ACT  inhaler, Inhale 1 puff into the lungs every 6 (six) hours as needed for wheezing or shortness of breath., Disp: , Rfl:  .  ALPRAZolam (XANAX) 0.25 MG tablet, Take 1 tablet (0.25 mg total) by mouth at bedtime as needed for anxiety., Disp: 30 tablet, Rfl: 0 .  budesonide-formoterol (SYMBICORT) 80-4.5 MCG/ACT inhaler, Inhale 2 puffs into the lungs 2 (two) times daily., Disp: , Rfl:  .  diclofenac (VOLTAREN) 50 MG EC tablet, Take 1 tablet (50 mg total) by mouth 2 (two) times daily. (Patient not taking: Reported on 09/29/2016), Disp: 30 tablet, Rfl: 0 .  fenofibrate (TRICOR) 145 MG tablet, Take 145 mg by mouth daily., Disp: , Rfl:  .  lisinopril-hydrochlorothiazide (ZESTORETIC) 20-12.5 MG tablet, Take 1 tablet by mouth daily., Disp: 90 tablet, Rfl: 3 .  omeprazole (PRILOSEC) 40 MG capsule, Take 40 mg by mouth daily., Disp: , Rfl:  .  venlafaxine (EFFEXOR) 75 MG tablet, Take 75 mg by mouth 2 (two) times daily., Disp: , Rfl:    Allergies  Allergen Reactions  . Codeine     . Penicillins Rash    ROS Review of Systems  Musculoskeletal: Positive for back pain.  Skin: Positive for rash (Karatosis on L forearm).  Neurological: Positive for tremors.  Psychiatric/Behavioral: Negative for confusion and suicidal ideas.  All other systems reviewed and are negative.     Objective:    Physical Exam  Constitutional: The patient is oriented to person, place, and time. Pt appears well-developed and well-nourished.  Head: Normocephalic and atraumatic.  Eyes: Pupils are equal, round, and reactive to light.  Neck: No JVD present. No tracheal deviation present. No thyromegaly present.  Cardiovascular: Regular rate and rhythm. No gallop. Pulmonary/Chest: Normal breath sounds. Lungs clear to auscultation. Abdominal: No abdominal tenderness. No guarding or rebound tenderness.. Musculoskeletal: Normal range of motion.  Lymphatic: No cervical adenopathy.  Neurological: No cranial nerve deficit.  Skin: Skin is warm and hydrated.  Psychiatric: The patient has a normal mood and affect.  BP 110/65   Pulse 79   Wt 219 lb (99.3 kg)   BMI 36.44 kg/m  Wt Readings from Last 3 Encounters:  07/05/19 219 lb (99.3 kg)  02/22/17 220 lb (99.8 kg)  10/13/16 220 lb (99.8 kg)     Health Maintenance Due  Topic Date Due  . Hepatitis C Screening  Never done  . COVID-19 Vaccine (1) Never done  . TETANUS/TDAP  Never done  . MAMMOGRAM  Never done  . COLONOSCOPY  Never done  . DEXA SCAN  Never done  . PNA vac Low Risk Adult (1 of 2 - PCV13) Never done    There are no preventive care reminders to display for this patient.  Lab Results  Component Value Date   TSH 1.021 06/29/2015   Lab Results  Component Value Date   WBC 6.1 02/22/2017   HGB 12.2 02/22/2017   HCT 35.7 02/22/2017   MCV 89.9 02/22/2017   PLT 220 02/22/2017   Lab Results  Component Value Date   NA 138 02/22/2017   K 4.0 02/22/2017   CO2 26 02/22/2017   GLUCOSE 121 (H) 02/22/2017   BUN 18  02/22/2017   CREATININE 0.83 02/22/2017   BILITOT 0.4 02/22/2017   ALKPHOS 54 02/22/2017   AST 51 (H) 02/22/2017   ALT 40 02/22/2017   PROT 7.8 02/22/2017   ALBUMIN 4.1 02/22/2017   CALCIUM 8.9 02/22/2017   ANIONGAP 8 02/22/2017   No results found for: CHOL No  results found for: HDL No results found for: LDLCALC No results found for: TRIG No results found for: CHOLHDL No results found for: HGBA1C    Assessment & Plan:   Problem List Items Addressed This Visit      Cardiovascular and Mediastinum   Essential hypertension    Blood pressure is stable today.      Relevant Medications   lisinopril-hydrochlorothiazide (ZESTORETIC) 20-12.5 MG tablet     Musculoskeletal and Integument   Dermatitis    We will make a referral to dermatology.        Other   Anxiety - Primary    It is chronic problem, patient does not have any signs or symptoms of depression.      Relevant Medications   ALPRAZolam (XANAX) 0.25 MG tablet   Chronic bilateral low back pain    Lose weight and walk daily.  She has an orthopedic specialist who operated on her he does not think any other surgical work-up will help her.      Obesity (BMI 35.0-39.9 without comorbidity)    [facility] diet was given tor the patient to lose weight      Tremor    Appears that patient has benign essential tremor we will however send her to neurology for evaluation          Meds ordered this encounter  Medications  . lisinopril-hydrochlorothiazide (ZESTORETIC) 20-12.5 MG tablet    Sig: Take 1 tablet by mouth daily.    Dispense:  90 tablet    Refill:  3  . ALPRAZolam (XANAX) 0.25 MG tablet    Sig: Take 1 tablet (0.25 mg total) by mouth at bedtime as needed for anxiety.    Dispense:  30 tablet    Refill:  0   1. Anxiety Anxiety stable on medication. - ALPRAZolam (XANAX) 0.25 MG tablet; Take 1 tablet (0.25 mg total) by mouth at bedtime as needed for anxiety.  Dispense: 30 tablet; Refill: 0  2. Chronic  bilateral low back pain, unspecified whether sciatica present He was advised to lose weight she can see her orthopedic doctor if needed.  3. Obesity (BMI 35.0-39.9 without comorbidity) Patient was advised to lose weight.  4. Essential hypertension Blood pressure is under control.  5. Dermatitis Refer to dermatology.  6. Tremor Appears to have benign essential tremor and patient wants to see a neurologist so we will send her.  She has a family history of parkinsonism the family is worried about that. Follow-up: Return in about 3 months (around 10/05/2019).   By signing my name below, I, [employer], attest that this documentation has been prepared under the direction and in the presence of Cletis Athens, MD. Electronically Signed: De Burrs, Medical Scribe. 07/05/19. 9:33 AM.  I personally performed the services described in this documentation, which was SCRIBED in my presence. The recorded information has been reviewed and considered accurate. It has been edited as necessary during review. Cletis Athens, MD

## 2019-07-05 NOTE — Assessment & Plan Note (Signed)
[facility] diet was given tor the patient to lose weight

## 2019-07-08 ENCOUNTER — Other Ambulatory Visit: Payer: Self-pay | Admitting: Internal Medicine

## 2019-07-21 ENCOUNTER — Other Ambulatory Visit: Payer: Self-pay | Admitting: Internal Medicine

## 2019-08-09 DIAGNOSIS — E038 Other specified hypothyroidism: Secondary | ICD-10-CM | POA: Diagnosis not present

## 2019-08-09 DIAGNOSIS — E538 Deficiency of other specified B group vitamins: Secondary | ICD-10-CM | POA: Diagnosis not present

## 2019-08-09 DIAGNOSIS — R251 Tremor, unspecified: Secondary | ICD-10-CM | POA: Diagnosis not present

## 2019-08-09 DIAGNOSIS — E559 Vitamin D deficiency, unspecified: Secondary | ICD-10-CM | POA: Diagnosis not present

## 2019-08-09 DIAGNOSIS — Z131 Encounter for screening for diabetes mellitus: Secondary | ICD-10-CM | POA: Diagnosis not present

## 2019-08-09 DIAGNOSIS — E519 Thiamine deficiency, unspecified: Secondary | ICD-10-CM | POA: Diagnosis not present

## 2019-08-09 DIAGNOSIS — E611 Iron deficiency: Secondary | ICD-10-CM | POA: Diagnosis not present

## 2019-08-09 DIAGNOSIS — R7309 Other abnormal glucose: Secondary | ICD-10-CM | POA: Diagnosis not present

## 2019-09-13 ENCOUNTER — Ambulatory Visit: Payer: Medicare HMO | Admitting: Dermatology

## 2019-09-26 ENCOUNTER — Other Ambulatory Visit: Payer: Self-pay

## 2019-09-26 ENCOUNTER — Encounter: Payer: Self-pay | Admitting: Internal Medicine

## 2019-09-26 ENCOUNTER — Ambulatory Visit (INDEPENDENT_AMBULATORY_CARE_PROVIDER_SITE_OTHER): Payer: Medicare HMO | Admitting: Internal Medicine

## 2019-09-26 VITALS — BP 131/69 | HR 81 | Ht 65.0 in | Wt 216.1 lb

## 2019-09-26 DIAGNOSIS — I10 Essential (primary) hypertension: Secondary | ICD-10-CM | POA: Diagnosis not present

## 2019-09-26 DIAGNOSIS — F419 Anxiety disorder, unspecified: Secondary | ICD-10-CM

## 2019-09-26 DIAGNOSIS — E669 Obesity, unspecified: Secondary | ICD-10-CM | POA: Diagnosis not present

## 2019-09-26 DIAGNOSIS — R0602 Shortness of breath: Secondary | ICD-10-CM | POA: Diagnosis not present

## 2019-09-26 MED ORDER — ALPRAZOLAM 0.25 MG PO TABS: 0.2500 mg | ORAL_TABLET | Freq: Every evening | ORAL | 1 refills | Status: AC | PRN

## 2019-09-26 NOTE — Assessment & Plan Note (Signed)
-   I encouraged the patient to lose weight.  - I educated them on making healthy dietary choices including eating more fruits and vegetables and less fried foods. - I encouraged the patient to exercise more, and educated on the benefits of exercise including weight loss, diabetes management, and hypertension management.   

## 2019-09-26 NOTE — Progress Notes (Signed)
Established Patient Office Visit  SUBJECTIVE:  Subjective  Patient ID: Sandra Dudley, female    DOB: 12-25-1950  Age: 69 y.o. MRN: [id number]  CC:  Chief Complaint  Patient presents with  . Shortness of Breath  . Breast Pain    left breast    HPI Sandra Dudley is a 69 y.o. female presenting today for shortness of breath with left breast pain.   Chest Pain  This is a recurrent problem. The onset quality is sudden. The problem has been waxing and waning. The pain is present in the lateral region. The pain is moderate. The quality of the pain is described as sharp and stabbing. Associated symptoms include back pain (left shoulder blade), diaphoresis, dizziness (with exertion due to panic) and shortness of breath. Pertinent negatives include no abdominal pain, cough or palpitations. Risk factors include being elderly, post-menopausal, sedentary lifestyle and lack of exercise.  Her past medical history is significant for anxiety/panic attacks, cancer, hyperlipidemia and hypertension.  Her family medical history is significant for hypertension. Prior diagnostic workup includes echocardiogram.  Shortness of Breath The problem occurs constantly. The problem has been waxing and waning. Associated symptoms include chest pain. Pertinent negatives include no abdominal pain, leg swelling or neck pain. The symptoms are aggravated by any activity, weather changes and emotional upset. The patient has no known risk factors for DVT/PE.     Past Medical History:  Diagnosis Date  . Anxiety   . Arthritis   . Cancer ([place])   . Chronic pain   . Enlarged heart   . High cholesterol   . Hypertension     Past Surgical History:  Procedure Laterality Date  . BACK SURGERY    . KNEE SURGERY    . SHOULDER SURGERY      Family History  Problem Relation Age of Onset  . Hypertension Mother   . Cancer Father     Social History   Socioeconomic History  . Marital status: Divorced    Spouse  name: Not on file  . Number of children: Not on file  . Years of education: Not on file  . Highest education level: Not on file  Occupational History  . Not on file  Tobacco Use  . Smoking status: Never Smoker  . Smokeless tobacco: Never Used  Substance and Sexual Activity  . Alcohol use: No  . Drug use: No  . Sexual activity: Not on file  Other Topics Concern  . Not on file  Social History Narrative  . Not on file   Social Determinants of Health   Financial Resource Strain:   . Difficulty of Paying Living Expenses: Not on file  Food Insecurity:   . Worried About Charity fundraiser in the Last Year: Not on file  . Ran Out of Food in the Last Year: Not on file  Transportation Needs:   . Lack of Transportation (Medical): Not on file  . Lack of Transportation (Non-Medical): Not on file  Physical Activity:   . Days of Exercise per Week: Not on file  . Minutes of Exercise per Session: Not on file  Stress:   . Feeling of Stress : Not on file  Social Connections:   . Frequency of Communication with Friends and Family: Not on file  . Frequency of Social Gatherings with Friends and Family: Not on file  . Attends Religious Services: Not on file  . Active Member of Clubs or Organizations: Not on file  .  Attends Archivist Meetings: Not on file  . Marital Status: Not on file  Intimate Partner Violence:   . Fear of Current or Ex-Partner: Not on file  . Emotionally Abused: Not on file  . Physically Abused: Not on file  . Sexually Abused: Not on file     Current Outpatient Medications:  .  albuterol (PROVENTIL HFA;VENTOLIN HFA) 108 (90 Base) MCG/ACT inhaler, Inhale 1 puff into the lungs every 6 (six) hours as needed for wheezing or shortness of breath., Disp: , Rfl:  .  ALPRAZolam (XANAX) 0.25 MG tablet, Take 1 tablet (0.25 mg total) by mouth at bedtime as needed for anxiety., Disp: 30 tablet, Rfl: 1 .  budesonide-formoterol (SYMBICORT) 80-4.5 MCG/ACT inhaler, Inhale 2  puffs into the lungs 2 (two) times daily., Disp: , Rfl:  .  diclofenac (VOLTAREN) 50 MG EC tablet, Take 1 tablet (50 mg total) by mouth 2 (two) times daily., Disp: 30 tablet, Rfl: 0 .  fenofibrate (TRICOR) 145 MG tablet, Take 145 mg by mouth daily., Disp: , Rfl:  .  gabapentin (NEURONTIN) 300 MG capsule, TAKE 1 CAPSULE BY MOUTH ONCE DAILY, Disp: 90 capsule, Rfl: 3 .  lisinopril-hydrochlorothiazide (ZESTORETIC) 20-12.5 MG tablet, Take 1 tablet by mouth daily., Disp: 90 tablet, Rfl: 3 .  meloxicam (MOBIC) 7.5 MG tablet, TAKE 1 TABLET BY MOUTH ONCE DAILY, Disp: 30 tablet, Rfl: 6 .  omeprazole (PRILOSEC) 40 MG capsule, Take 40 mg by mouth daily., Disp: , Rfl:  .  venlafaxine (EFFEXOR) 75 MG tablet, Take 75 mg by mouth 2 (two) times daily., Disp: , Rfl:    Allergies  Allergen Reactions  . Codeine   . Penicillins Rash    ROS Review of Systems  Constitutional: Positive for diaphoresis.  HENT: Negative.   Eyes: Negative.   Respiratory: Positive for shortness of breath. Negative for cough.   Cardiovascular: Positive for chest pain. Negative for palpitations and leg swelling.  Gastrointestinal: Negative.  Negative for abdominal pain.  Endocrine: Negative.   Genitourinary: Negative.   Musculoskeletal: Positive for back pain (left shoulder blade). Negative for neck pain.  Skin: Negative.   Allergic/Immunologic: Negative.   Neurological: Positive for dizziness (with exertion due to panic).  Hematological: Negative.   Psychiatric/Behavioral: Negative.   All other systems reviewed and are negative.    OBJECTIVE:    Physical Exam Vitals reviewed.  Constitutional:      Appearance: Normal appearance.  HENT:     Mouth/Throat:     Mouth: Mucous membranes are moist.  Eyes:     Pupils: Pupils are equal, round, and reactive to light.  Neck:     Vascular: No carotid bruit.  Cardiovascular:     Rate and Rhythm: Normal rate and regular rhythm.     Pulses: Normal pulses.     Heart sounds:  Normal heart sounds.  Pulmonary:     Effort: Pulmonary effort is normal.     Breath sounds: Normal breath sounds.  Abdominal:     General: Bowel sounds are normal.     Palpations: Abdomen is soft. There is no hepatomegaly, splenomegaly or mass.     Tenderness: There is no abdominal tenderness.     Hernia: No hernia is present.  Musculoskeletal:        General: No tenderness.     Cervical back: Neck supple.     Right lower leg: No edema.     Left lower leg: No edema.  Skin:    Findings: No rash.  Neurological:     Mental Status: She is alert and oriented to person, place, and time.     Motor: No weakness.  Psychiatric:        Mood and Affect: Mood and affect normal.        Behavior: Behavior normal.     BP 131/69   Pulse 81   Ht 5\' 5"  (1.651 m)   Wt 216 lb 1.6 oz (98 kg)   BMI 35.96 kg/m  Wt Readings from Last 3 Encounters:  09/26/19 216 lb 1.6 oz (98 kg)  07/05/19 219 lb (99.3 kg)  02/22/17 220 lb (99.8 kg)    Health Maintenance Due  Topic Date Due  . Hepatitis C Screening  Never done  . COVID-19 Vaccine (1) Never done  . TETANUS/TDAP  Never done  . MAMMOGRAM  Never done  . COLONOSCOPY  Never done  . DEXA SCAN  Never done  . PNA vac Low Risk Adult (1 of 2 - PCV13) Never done  . INFLUENZA VACCINE  08/26/2019    There are no preventive care reminders to display for this patient.  CBC Latest Ref Rng & Units 02/22/2017 06/29/2015 05/28/2013  WBC 3.6 - 11.0 K/uL 6.1 8.7 6.8  Hemoglobin 12.0 - 16.0 g/dL 12.2 12.2 12.5  Hematocrit 35 - 47 % 35.7 35.7 38.1  Platelets 150 - 440 K/uL 220 189 205   CMP Latest Ref Rng & Units 02/22/2017 06/29/2015 05/28/2013  Glucose 65 - 99 mg/dL 121(H) 100(H) 115(H)  BUN 6 - 20 mg/dL 18 29(H) 19(H)  Creatinine 0.44 - 1.00 mg/dL 0.83 1.25(H) 0.81  Sodium 135 - 145 mmol/L 138 140 141  Potassium 3.5 - 5.1 mmol/L 4.0 4.2 3.3(L)  Chloride 101 - 111 mmol/L 104 110 106  CO2 22 - 32 mmol/L 26 22 30   Calcium 8.9 - 10.3 mg/dL 8.9 9.1 9.8  Total  Protein 6.5 - 8.1 g/dL 7.8 7.1 7.7  Total Bilirubin 0.3 - 1.2 mg/dL 0.4 0.5 0.4  Alkaline Phos 38 - 126 U/L 54 51 40(L)  AST 15 - 41 U/L 51(H) 83(H) 45(H)  ALT 14 - 54 U/L 40 71(H) 46    Lab Results  Component Value Date   TSH 1.021 06/29/2015   Lab Results  Component Value Date   ALBUMIN 4.1 02/22/2017   ANIONGAP 8 02/22/2017   No results found for: CHOL, HDL, LDLCALC, CHOLHDL No results found for: TRIG No results found for: HGBA1C    ASSESSMENT & PLAN:   Problem List Items Addressed This Visit      Cardiovascular and Mediastinum   Essential hypertension    - Today, the patient's blood pressure is well managed on lisinopril. - The patient will continue the current treatment regimen.  - I encouraged the patient to eat a low-sodium diet to help control blood pressure. - I encouraged the patient to live an active lifestyle and complete activities that increases heart rate to 85% target heart rate at least 5 times per week for one hour.            Other   Anxiety    - Patient experiencing high levels of anxiety.  - Encouraged patient to engage in relaxing activities like yoga, meditation, journaling, going for a walk, or participating in a hobby.  - Encouraged patient to reach out to trusted friends or family members about recent struggles       Relevant Medications   ALPRAZolam (XANAX) 0.25 MG tablet   Obesity (BMI  35.0-39.9 without comorbidity)    - I encouraged the patient to lose weight.  - I educated them on making healthy dietary choices including eating more fruits and vegetables and less fried foods. - I encouraged the patient to exercise more, and educated on the benefits of exercise including weight loss, diabetes management, and hypertension management.        Shortness of breath - Primary    Pt complains of anterior left sided chest pain. The pain does not radiate to the neck, shoulder, or arm. Pain is pleuritic. No diaphoresis, or emesis. ECG revealed  nonspecific ST changes, no acute changes were noted. Physical exam is normal without any localized tenderness to the left chest. Pt doesn't smoke. She does not want to get the COVID19 vaccine. We'll set her up for labs and a general physical exam.       Relevant Orders   EKG 12-Lead      Meds ordered this encounter  Medications  . ALPRAZolam (XANAX) 0.25 MG tablet    Sig: Take 1 tablet (0.25 mg total) by mouth at bedtime as needed for anxiety.    Dispense:  30 tablet    Refill:  1    EKG: normal sinus rhythm, Nonspecific ST changes. No acute changes.   Follow-up: No follow-ups on file.    Dr. Jane Canary [facility] [address], [place], [place] [address]   By signing my name below, I, [employer], attest that this documentation has been prepared under the direction and in the presence of Cletis Athens, MD. Electronically Signed: Cletis Athens, MD 09/26/19, 12:24 PM   I personally performed the services described in this documentation, which was SCRIBED in my presence. The recorded information has been reviewed and considered accurate. It has been edited as necessary during review. Cletis Athens, MD

## 2019-09-26 NOTE — Assessment & Plan Note (Signed)
Pt complains of anterior left sided chest pain. The pain does not radiate to the neck, shoulder, or arm. Pain is pleuritic. No diaphoresis, or emesis. ECG revealed nonspecific ST changes, no acute changes were noted. Physical exam is normal without any localized tenderness to the left chest. Pt doesn't smoke. She does not want to get the COVID19 vaccine. We'll set her up for labs and a general physical exam.

## 2019-09-26 NOTE — Assessment & Plan Note (Signed)
-   Today, the patient's blood pressure is well managed on lisinopril. - The patient will continue the current treatment regimen.  - I encouraged the patient to eat a low-sodium diet to help control blood pressure. - I encouraged the patient to live an active lifestyle and complete activities that increases heart rate to 85% target heart rate at least 5 times per week for one hour.     

## 2019-09-26 NOTE — Assessment & Plan Note (Signed)
-   Patient experiencing high levels of anxiety.  - Encouraged patient to engage in relaxing activities like yoga, meditation, journaling, going for a walk, or participating in a hobby.  - Encouraged patient to reach out to trusted friends or family members about recent struggles 

## 2019-11-05 ENCOUNTER — Encounter: Payer: Medicare HMO | Admitting: Internal Medicine

## 2019-11-13 ENCOUNTER — Other Ambulatory Visit: Payer: Self-pay

## 2019-11-13 ENCOUNTER — Ambulatory Visit (INDEPENDENT_AMBULATORY_CARE_PROVIDER_SITE_OTHER): Payer: Medicare HMO

## 2019-11-13 DIAGNOSIS — Z20822 Contact with and (suspected) exposure to covid-19: Secondary | ICD-10-CM

## 2019-11-13 LAB — POC COVID19 BINAXNOW: SARS Coronavirus 2 Ag: POSITIVE — AB

## 2019-11-14 ENCOUNTER — Other Ambulatory Visit: Payer: Self-pay | Admitting: Adult Health

## 2019-11-14 ENCOUNTER — Ambulatory Visit (HOSPITAL_COMMUNITY)
Admission: RE | Admit: 2019-11-14 | Discharge: 2019-11-14 | Disposition: A | Discharge status: Home / Self Care | Payer: Medicare Other | Source: Ambulatory Visit | Attending: Pulmonary Disease | Admitting: Pulmonary Disease

## 2019-11-14 DIAGNOSIS — Z23 Encounter for immunization: Secondary | ICD-10-CM | POA: Diagnosis not present

## 2019-11-14 DIAGNOSIS — U071 COVID-19: Secondary | ICD-10-CM

## 2019-11-14 MED ORDER — FAMOTIDINE IN NACL 20-0.9 MG/50ML-% IV SOLN: 20.0000 mg | Freq: Once | INTRAVENOUS | Status: DC | PRN

## 2019-11-14 MED ORDER — ALBUTEROL SULFATE HFA 108 (90 BASE) MCG/ACT IN AERS: 2.0000 | INHALATION_SPRAY | Freq: Once | RESPIRATORY_TRACT | Status: DC | PRN

## 2019-11-14 MED ORDER — DIPHENHYDRAMINE HCL 50 MG/ML IJ SOLN: 50.0000 mg | Freq: Once | INTRAMUSCULAR | Status: DC | PRN

## 2019-11-14 MED ORDER — METHYLPREDNISOLONE SODIUM SUCC 125 MG IJ SOLR: 125.0000 mg | Freq: Once | INTRAMUSCULAR | Status: DC | PRN

## 2019-11-14 MED ORDER — SODIUM CHLORIDE 0.9 % IV SOLN: INTRAVENOUS | Status: DC | PRN

## 2019-11-14 MED ORDER — EPINEPHRINE 0.3 MG/0.3ML IJ SOAJ: 0.3000 mg | Freq: Once | INTRAMUSCULAR | Status: DC | PRN

## 2019-11-14 MED ORDER — SODIUM CHLORIDE 0.9 % IV SOLN: Freq: Once | INTRAVENOUS | Status: AC

## 2019-11-14 NOTE — Progress Notes (Signed)
I connected by phone with Sandra Dudley on 11/14/2019 at 10:44 AM to discuss the potential use of a new treatment for mild to moderate COVID-19 viral infection in non-hospitalized patients.  This patient is a 69 y.o. female that meets the FDA criteria for Emergency Use Authorization of COVID monoclonal antibody casirivimab/imdevimab or bamlanivimab/eteseviamb.  Has a (+) direct SARS-CoV-2 viral test result  Has mild or moderate COVID-19   Is NOT hospitalized due to COVID-19  Is within 10 days of symptom onset  Has at least one of the high risk factor(s) for progression to severe COVID-19 and/or hospitalization as defined in EUA.  Specific high risk criteria : Older age (>/= 70 yo)   I have spoken and communicated the following to the patient or parent/caregiver regarding COVID monoclonal antibody treatment:  1. FDA has authorized the emergency use for the treatment of mild to moderate COVID-19 in adults and pediatric patients with positive results of direct SARS-CoV-2 viral testing who are 55 years of age and older weighing at least 40 kg, and who are at high risk for progressing to severe COVID-19 and/or hospitalization.  2. The significant known and potential risks and benefits of COVID monoclonal antibody, and the extent to which such potential risks and benefits are unknown.  3. Information on available alternative treatments and the risks and benefits of those alternatives, including clinical trials.  4. Patients treated with COVID monoclonal antibody should continue to self-isolate and use infection control measures (e.g., wear mask, isolate, social distance, avoid sharing personal items, clean and disinfect "high touch" surfaces, and frequent handwashing) according to CDC guidelines.   5. The patient or parent/caregiver has the option to accept or refuse COVID monoclonal antibody treatment.  After reviewing this information with the patient, the patient has agreed to receive  one of the available covid 19 monoclonal antibodies and will be provided an appropriate fact sheet prior to infusion. Scot Dock, NP 11/14/2019 10:44 AM

## 2019-11-14 NOTE — Progress Notes (Signed)
  Diagnosis: COVID-19  Physician: Asencion Noble, MD  Procedure: [facility] Med: bamlanivimab\etesevimab infusion - Provided patient with bamlanimivab\etesevimab fact sheet for patients, parents and caregivers prior to infusion.  Complications: No immediate complications noted.  Discharge: Discharged home   [name] 11/14/2019

## 2019-11-14 NOTE — Discharge Instructions (Signed)

## 2019-12-31 ENCOUNTER — Ambulatory Visit: Payer: Medicare Other | Admitting: Internal Medicine

## 2019-12-31 ENCOUNTER — Other Ambulatory Visit: Payer: Self-pay

## 2019-12-31 MED ORDER — ALPRAZOLAM 0.25 MG PO TABS: 0.2500 mg | ORAL_TABLET | Freq: Two times a day (BID) | ORAL | 0 refills | Status: AC | PRN

## 2019-12-31 MED ORDER — LISINOPRIL-HYDROCHLOROTHIAZIDE 20-12.5 MG PO TABS: 1.0000 | ORAL_TABLET | Freq: Every day | ORAL | 3 refills | Status: AC

## 2020-01-11 ENCOUNTER — Other Ambulatory Visit: Payer: Self-pay | Admitting: Internal Medicine

## 2020-02-12 ENCOUNTER — Other Ambulatory Visit: Payer: Self-pay | Admitting: Internal Medicine

## 2020-02-12 DIAGNOSIS — Z8616 Personal history of COVID-19: Secondary | ICD-10-CM | POA: Diagnosis not present

## 2020-02-12 DIAGNOSIS — E559 Vitamin D deficiency, unspecified: Secondary | ICD-10-CM | POA: Diagnosis not present

## 2020-02-12 DIAGNOSIS — Z1231 Encounter for screening mammogram for malignant neoplasm of breast: Secondary | ICD-10-CM

## 2020-02-12 DIAGNOSIS — E782 Mixed hyperlipidemia: Secondary | ICD-10-CM | POA: Diagnosis not present

## 2020-02-12 DIAGNOSIS — J9811 Atelectasis: Secondary | ICD-10-CM | POA: Diagnosis not present

## 2020-02-12 DIAGNOSIS — Z79899 Other long term (current) drug therapy: Secondary | ICD-10-CM | POA: Diagnosis not present

## 2020-02-12 DIAGNOSIS — I10 Essential (primary) hypertension: Secondary | ICD-10-CM | POA: Diagnosis not present

## 2020-02-12 DIAGNOSIS — Z1211 Encounter for screening for malignant neoplasm of colon: Secondary | ICD-10-CM | POA: Diagnosis not present

## 2020-02-12 DIAGNOSIS — R7303 Prediabetes: Secondary | ICD-10-CM | POA: Diagnosis not present

## 2020-02-12 DIAGNOSIS — Z0000 Encounter for general adult medical examination without abnormal findings: Secondary | ICD-10-CM | POA: Diagnosis not present

## 2020-02-12 DIAGNOSIS — F411 Generalized anxiety disorder: Secondary | ICD-10-CM | POA: Diagnosis not present

## 2020-03-03 ENCOUNTER — Ambulatory Visit
Admission: RE | Admit: 2020-03-03 | Discharge: 2020-03-03 | Disposition: A | Discharge status: Home / Self Care | Payer: Medicare HMO | Source: Ambulatory Visit | Attending: Internal Medicine | Admitting: Internal Medicine

## 2020-03-03 ENCOUNTER — Other Ambulatory Visit: Payer: Self-pay

## 2020-03-03 DIAGNOSIS — Z1231 Encounter for screening mammogram for malignant neoplasm of breast: Secondary | ICD-10-CM | POA: Diagnosis not present

## 2020-03-03 DIAGNOSIS — Z1211 Encounter for screening for malignant neoplasm of colon: Secondary | ICD-10-CM | POA: Diagnosis not present

## 2020-03-09 LAB — COLOGUARD: COLOGUARD: NEGATIVE

## 2020-04-15 DIAGNOSIS — I38 Endocarditis, valve unspecified: Secondary | ICD-10-CM | POA: Diagnosis not present

## 2020-04-21 DIAGNOSIS — Z0100 Encounter for examination of eyes and vision without abnormal findings: Secondary | ICD-10-CM | POA: Diagnosis not present

## 2020-04-21 DIAGNOSIS — H5203 Hypermetropia, bilateral: Secondary | ICD-10-CM | POA: Diagnosis not present

## 2020-06-03 DIAGNOSIS — Z6841 Body Mass Index (BMI) 40.0 and over, adult: Secondary | ICD-10-CM | POA: Diagnosis not present

## 2020-06-03 DIAGNOSIS — I10 Essential (primary) hypertension: Secondary | ICD-10-CM | POA: Diagnosis not present

## 2020-06-03 DIAGNOSIS — R1032 Left lower quadrant pain: Secondary | ICD-10-CM | POA: Diagnosis not present

## 2020-08-19 ENCOUNTER — Other Ambulatory Visit (HOSPITAL_COMMUNITY): Payer: Self-pay | Admitting: Internal Medicine

## 2020-08-19 ENCOUNTER — Other Ambulatory Visit: Payer: Self-pay | Admitting: Internal Medicine

## 2020-08-19 DIAGNOSIS — R1032 Left lower quadrant pain: Secondary | ICD-10-CM

## 2020-08-19 DIAGNOSIS — E559 Vitamin D deficiency, unspecified: Secondary | ICD-10-CM | POA: Diagnosis not present

## 2020-08-19 DIAGNOSIS — E785 Hyperlipidemia, unspecified: Secondary | ICD-10-CM | POA: Diagnosis not present

## 2020-08-19 DIAGNOSIS — I10 Essential (primary) hypertension: Secondary | ICD-10-CM | POA: Diagnosis not present

## 2020-08-19 DIAGNOSIS — Z0000 Encounter for general adult medical examination without abnormal findings: Secondary | ICD-10-CM | POA: Diagnosis not present

## 2020-08-19 DIAGNOSIS — Z79899 Other long term (current) drug therapy: Secondary | ICD-10-CM | POA: Diagnosis not present

## 2020-08-19 DIAGNOSIS — R270 Ataxia, unspecified: Secondary | ICD-10-CM | POA: Diagnosis not present

## 2020-08-19 DIAGNOSIS — R739 Hyperglycemia, unspecified: Secondary | ICD-10-CM | POA: Diagnosis not present

## 2020-09-03 ENCOUNTER — Other Ambulatory Visit: Payer: Self-pay

## 2020-09-03 ENCOUNTER — Ambulatory Visit
Admission: RE | Admit: 2020-09-03 | Discharge: 2020-09-03 | Disposition: A | Discharge status: Home / Self Care | Payer: Medicare HMO | Source: Ambulatory Visit | Attending: Internal Medicine | Admitting: Internal Medicine

## 2020-09-03 DIAGNOSIS — R1032 Left lower quadrant pain: Secondary | ICD-10-CM | POA: Diagnosis not present

## 2020-09-03 DIAGNOSIS — K760 Fatty (change of) liver, not elsewhere classified: Secondary | ICD-10-CM | POA: Diagnosis not present

## 2020-09-03 MED ORDER — IOHEXOL 300 MG/ML  SOLN
100.0000 mL | Freq: Once | INTRAMUSCULAR | Status: AC | PRN
  Administered 2020-09-03: 100 mL via INTRAVENOUS

## 2020-12-07 ENCOUNTER — Other Ambulatory Visit: Payer: Self-pay

## 2020-12-07 ENCOUNTER — Ambulatory Visit
Admission: EM | Admit: 2020-12-07 | Discharge: 2020-12-07 | Disposition: A | Discharge status: Home / Self Care | Payer: Medicare HMO

## 2020-12-07 DIAGNOSIS — M5431 Sciatica, right side: Secondary | ICD-10-CM | POA: Diagnosis not present

## 2020-12-07 NOTE — Discharge Instructions (Signed)
Increase the Mobic twice for 3 days and increase more fluids while on it.  Follow up with your Spine specialist next week

## 2020-12-07 NOTE — ED Provider Notes (Signed)
[facility]    CSN: [id number] Arrival date & time: 12/07/20  1309      History   Chief Complaint Chief Complaint  Patient presents with   Sciatica    HPI GULIANNA HORNSBY is a 70 y.o. female who presents with R sciatica flair since last week. Pain radiates to her R groin down to her R knee. She has been sitting her 53 month old great granddaughter and has to bend over and lift her repetitively. Has hx of lumbar fusion at L3-L4 and L4-L5. Her pain starts on R SI and is provoked with spine movement. Denies weakness or paresthesia to lower legs. Denies saddle paresthesia, bowel or bladder incontinence. Has not seen her spine specialist in 3 years. Denies abdominal pain.     Past Medical History:  Diagnosis Date   Anxiety    Arthritis    Cancer ([place])    skin   Chronic pain    Enlarged heart    High cholesterol    Hypertension     Patient Active Problem List   Diagnosis Date Noted   Shortness of breath 09/26/2019   Anxiety 07/05/2019   Chronic bilateral low back pain 07/05/2019   Obesity (BMI 35.0-39.9 without comorbidity) 07/05/2019   Dermatitis 07/05/2019   Essential hypertension 07/05/2019   Tremor 07/05/2019    Past Surgical History:  Procedure Laterality Date   BACK SURGERY     KNEE SURGERY     SHOULDER SURGERY      OB History   No obstetric history on file.      Home Medications    Prior to Admission medications   Medication Sig Start Date End Date Taking? Authorizing Provider  albuterol (PROVENTIL HFA;VENTOLIN HFA) 108 (90 Base) MCG/ACT inhaler Inhale 1 puff into the lungs every 6 (six) hours as needed for wheezing or shortness of breath.   Yes [provider]  ALPRAZolam (XANAX) 0.25 MG tablet Take 1 tablet (0.25 mg total) by mouth 2 (two) times daily as needed. 12/31/19  Yes Masoud, Viann Shove, MD  budesonide-formoterol (SYMBICORT) 80-4.5 MCG/ACT inhaler Inhale 2 puffs into the lungs 2 (two) times daily.   Yes [provider]   fenofibrate (TRICOR) 145 MG tablet Take 145 mg by mouth daily.   Yes [provider]  lisinopril-hydrochlorothiazide (ZESTORETIC) 20-12.5 MG tablet Take 1 tablet by mouth daily. 12/31/19  Yes Masoud, Viann Shove, MD  meloxicam (MOBIC) 7.5 MG tablet TAKE 1 TABLET BY MOUTH ONCE DAILY 07/09/19  Yes Masoud, Viann Shove, MD  omeprazole (PRILOSEC) 40 MG capsule TAKE 1 CAPSULE BY MOUTH ONCE DAILY 01/11/20  Yes Beckie Salts, FNP  venlafaxine (EFFEXOR) 75 MG tablet Take 75 mg by mouth 2 (two) times daily.   Yes [provider]    Family History Family History  Problem Relation Age of Onset   Hypertension Mother    Cancer Father     Social History Social History   Tobacco Use   Smoking status: Never   Smokeless tobacco: Never  Substance Use Topics   Alcohol use: No   Drug use: No     Allergies   Codeine and Penicillins   Review of Systems Review of Systems  Constitutional:  Negative for fever.  Gastrointestinal:  Negative for abdominal pain.  Genitourinary:  Negative for enuresis.  Musculoskeletal:  Positive for back pain.  Skin:  Negative for rash.  Neurological:  Negative for weakness and numbness.  Hematological:  Negative for adenopathy.    Physical Exam Triage Vital  Signs ED Triage Vitals  Enc Vitals Group     BP 12/07/20 1348 (!) 128/58     Pulse Rate 12/07/20 1348 73     Resp 12/07/20 1348 17     Temp 12/07/20 1348 97.8 F (36.6 C)     Temp Source 12/07/20 1348 Oral     SpO2 12/07/20 1348 96 %     Weight --      Height --      Head Circumference --      Peak Flow --      Pain Score 12/07/20 1346 9     Pain Loc --      Pain Edu? --      Excl. in [place]? --    No data found.  Updated Vital Signs BP (!) 128/58 (BP Location: Right Arm)   Pulse 73   Temp 97.8 F (36.6 C) (Oral)   Resp 17   SpO2 96%   Visual Acuity Right Eye Distance:   Left Eye Distance:   Bilateral Distance:    Right Eye Near:   Left Eye Near:    Bilateral Near:      Physical Exam Vitals and nursing note reviewed.  Constitutional:      General: She is not in acute distress.    Appearance: Normal appearance. She is obese. She is not toxic-appearing.  HENT:     Right Ear: External ear normal.     Left Ear: External ear normal.  Eyes:     General: No scleral icterus.    Conjunctiva/sclera: Conjunctivae normal.  Pulmonary:     Effort: Pulmonary effort is normal.  Musculoskeletal:     Cervical back: Neck supple.     Comments: BACK- has local tenderness on R SI region. Has pain that provoked her R groin pain with R lateral flexion and straightening her spine from that position. Anterior flexion only provoked R SI pain. Has neg SLR.   Skin:    General: Skin is warm and dry.     Findings: No rash.  Neurological:     Mental Status: She is alert and oriented to person, place, and time.     Motor: No weakness.     Gait: Gait normal.     Deep Tendon Reflexes: Reflexes normal.  Psychiatric:        Mood and Affect: Mood normal.        Behavior: Behavior normal.        Thought Content: Thought content normal.        Judgment: Judgment normal.     UC Treatments / Results  Labs (all labs ordered are listed, but only abnormal results are displayed) Labs Reviewed - No data to display  EKG   Radiology No results found.  Procedures Procedures (including critical care time)  Medications Ordered in UC Medications - No data to display  Initial Impression / Assessment and Plan / UC Course  I have reviewed the triage vital signs and the nursing notes. R sciatica I reviewed her last CMP and her creatinine was normal.  She declined prednisone. Since she has been on Mobic one a day, I increase it to bid x 3 days, then can go back to daily. Needs to Fu with her spine surgeon next week.     Final Clinical Impressions(s) / UC Diagnoses   Final diagnoses:  Sciatica of right side     Discharge Instructions      Increase the Mobic twice for 3  days and increase more fluids while on it.  Follow up with your Spine specialist next week      ED Prescriptions   None    PDMP not reviewed this encounter.   Shelby Mattocks, PA-C 12/07/20 1435

## 2020-12-07 NOTE — ED Notes (Signed)
Patient states that she has been having her sciatica flare up over the last week. States that pain radiates to her right groin and right knee.

## 2020-12-30 DIAGNOSIS — F411 Generalized anxiety disorder: Secondary | ICD-10-CM | POA: Diagnosis not present

## 2020-12-30 DIAGNOSIS — R7303 Prediabetes: Secondary | ICD-10-CM | POA: Diagnosis not present

## 2020-12-30 DIAGNOSIS — I10 Essential (primary) hypertension: Secondary | ICD-10-CM | POA: Diagnosis not present

## 2020-12-30 DIAGNOSIS — E782 Mixed hyperlipidemia: Secondary | ICD-10-CM | POA: Diagnosis not present

## 2020-12-30 DIAGNOSIS — Z79899 Other long term (current) drug therapy: Secondary | ICD-10-CM | POA: Diagnosis not present

## 2020-12-30 DIAGNOSIS — Z1231 Encounter for screening mammogram for malignant neoplasm of breast: Secondary | ICD-10-CM | POA: Diagnosis not present

## 2021-01-13 DIAGNOSIS — Z79899 Other long term (current) drug therapy: Secondary | ICD-10-CM | POA: Diagnosis not present

## 2021-01-13 DIAGNOSIS — R7303 Prediabetes: Secondary | ICD-10-CM | POA: Diagnosis not present

## 2021-01-13 DIAGNOSIS — E782 Mixed hyperlipidemia: Secondary | ICD-10-CM | POA: Diagnosis not present

## 2021-01-13 DIAGNOSIS — I10 Essential (primary) hypertension: Secondary | ICD-10-CM | POA: Diagnosis not present

## 2021-02-06 DIAGNOSIS — R200 Anesthesia of skin: Secondary | ICD-10-CM | POA: Diagnosis not present

## 2021-02-06 DIAGNOSIS — E538 Deficiency of other specified B group vitamins: Secondary | ICD-10-CM | POA: Diagnosis not present

## 2021-02-06 DIAGNOSIS — R259 Unspecified abnormal involuntary movements: Secondary | ICD-10-CM | POA: Diagnosis not present

## 2021-02-10 ENCOUNTER — Other Ambulatory Visit: Payer: Self-pay | Admitting: Neurology

## 2021-02-10 DIAGNOSIS — R259 Unspecified abnormal involuntary movements: Secondary | ICD-10-CM

## 2021-02-20 ENCOUNTER — Ambulatory Visit
Admission: RE | Admit: 2021-02-20 | Discharge: 2021-02-20 | Disposition: A | Discharge status: Home / Self Care | Payer: Medicare HMO | Source: Ambulatory Visit | Attending: Neurology | Admitting: Neurology

## 2021-02-20 ENCOUNTER — Other Ambulatory Visit: Payer: Medicare HMO

## 2021-02-20 DIAGNOSIS — R259 Unspecified abnormal involuntary movements: Secondary | ICD-10-CM

## 2021-02-20 DIAGNOSIS — G20 Parkinson's disease: Secondary | ICD-10-CM | POA: Diagnosis not present

## 2021-02-20 DIAGNOSIS — I619 Nontraumatic intracerebral hemorrhage, unspecified: Secondary | ICD-10-CM | POA: Diagnosis not present

## 2021-05-22 ENCOUNTER — Other Ambulatory Visit: Payer: Self-pay | Admitting: Internal Medicine

## 2021-05-22 DIAGNOSIS — Z0000 Encounter for general adult medical examination without abnormal findings: Secondary | ICD-10-CM | POA: Diagnosis not present

## 2021-05-22 DIAGNOSIS — Z79899 Other long term (current) drug therapy: Secondary | ICD-10-CM | POA: Diagnosis not present

## 2021-05-22 DIAGNOSIS — Z1231 Encounter for screening mammogram for malignant neoplasm of breast: Secondary | ICD-10-CM

## 2021-05-22 DIAGNOSIS — F419 Anxiety disorder, unspecified: Secondary | ICD-10-CM | POA: Diagnosis not present

## 2021-05-22 DIAGNOSIS — R053 Chronic cough: Secondary | ICD-10-CM | POA: Diagnosis not present

## 2021-05-22 DIAGNOSIS — E669 Obesity, unspecified: Secondary | ICD-10-CM | POA: Diagnosis not present

## 2021-05-22 DIAGNOSIS — R739 Hyperglycemia, unspecified: Secondary | ICD-10-CM | POA: Diagnosis not present

## 2021-05-22 DIAGNOSIS — I10 Essential (primary) hypertension: Secondary | ICD-10-CM | POA: Diagnosis not present

## 2021-05-22 DIAGNOSIS — E785 Hyperlipidemia, unspecified: Secondary | ICD-10-CM | POA: Diagnosis not present

## 2021-05-22 DIAGNOSIS — R059 Cough, unspecified: Secondary | ICD-10-CM | POA: Diagnosis not present

## 2021-05-22 DIAGNOSIS — E559 Vitamin D deficiency, unspecified: Secondary | ICD-10-CM | POA: Diagnosis not present

## 2021-06-25 ENCOUNTER — Ambulatory Visit
Admission: EM | Admit: 2021-06-25 | Discharge: 2021-06-25 | Disposition: A | Discharge status: Home / Self Care | Payer: Medicare HMO | Attending: Physician Assistant | Admitting: Physician Assistant

## 2021-06-25 DIAGNOSIS — J0100 Acute maxillary sinusitis, unspecified: Secondary | ICD-10-CM

## 2021-06-25 MED ORDER — DOXYCYCLINE HYCLATE 100 MG PO CAPS: 100.0000 mg | ORAL_CAPSULE | Freq: Two times a day (BID) | ORAL | 0 refills | Status: AC

## 2021-06-25 MED ORDER — IPRATROPIUM BROMIDE 0.06 % NA SOLN: 2.0000 | Freq: Four times a day (QID) | NASAL | 0 refills | Status: DC

## 2021-06-25 NOTE — ED Provider Notes (Signed)
[facility]    CSN: [id number] Arrival date & time: 06/25/21  1107      History   Chief Complaint Chief Complaint  Patient presents with   Nose Problem    HPI Sandra Dudley is a 71 y.o. female presenting for about 1 week history of right sided facial pain/pressure and headaches.  Patient says she started to have yellowish foul-smelling nasal drainage over the past day.  She says it also tastes awful when it drains down the back of her throat.  Feels pain into her teeth.  No associated fever, cough, sore throat, breathing difficulty, nausea vomiting or diarrhea.  No COVID exposure.  No sick contacts.  Has not been taking any over-the-counter medicine for her symptoms.  Believes she may have a sinus infection.  Reports she has not been sick in the long time with any similar symptoms.  No other complaints.  HPI  Past Medical History:  Diagnosis Date   Anxiety    Arthritis    Cancer ([place])    skin   Chronic pain    Enlarged heart    High cholesterol    Hypertension     Patient Active Problem List   Diagnosis Date Noted   Shortness of breath 09/26/2019   Anxiety 07/05/2019   Chronic bilateral low back pain 07/05/2019   Obesity (BMI 35.0-39.9 without comorbidity) 07/05/2019   Dermatitis 07/05/2019   Essential hypertension 07/05/2019   Tremor 07/05/2019    Past Surgical History:  Procedure Laterality Date   BACK SURGERY     KNEE SURGERY     SHOULDER SURGERY      OB History   No obstetric history on file.      Home Medications    Prior to Admission medications   Medication Sig Start Date End Date Taking? Authorizing Provider  albuterol (PROVENTIL HFA;VENTOLIN HFA) 108 (90 Base) MCG/ACT inhaler Inhale 1 puff into the lungs every 6 (six) hours as needed for wheezing or shortness of breath.   Yes [provider]  ALPRAZolam (XANAX) 0.25 MG tablet Take 1 tablet (0.25 mg total) by mouth 2 (two) times daily as needed. 12/31/19  Yes Masoud, Viann Shove,  MD  budesonide-formoterol (SYMBICORT) 80-4.5 MCG/ACT inhaler Inhale 2 puffs into the lungs 2 (two) times daily.   Yes [provider]  doxycycline (VIBRAMYCIN) 100 MG capsule Take 1 capsule (100 mg total) by mouth 2 (two) times daily for 7 days. 06/25/21 07/02/21 Yes Danton Clap, PA-C  fenofibrate (TRICOR) 145 MG tablet Take 145 mg by mouth daily.   Yes [provider]  ipratropium (ATROVENT) 0.06 % nasal spray Place 2 sprays into both nostrils 4 (four) times daily. 06/25/21  Yes Danton Clap, PA-C  lisinopril-hydrochlorothiazide (ZESTORETIC) 20-12.5 MG tablet Take 1 tablet by mouth daily. 12/31/19  Yes Masoud, Viann Shove, MD  meloxicam (MOBIC) 7.5 MG tablet TAKE 1 TABLET BY MOUTH ONCE DAILY 07/09/19  Yes Masoud, Viann Shove, MD  omeprazole (PRILOSEC) 40 MG capsule TAKE 1 CAPSULE BY MOUTH ONCE DAILY 01/11/20  Yes Beckie Salts, FNP  venlafaxine (EFFEXOR) 75 MG tablet Take 75 mg by mouth 2 (two) times daily.   Yes [provider]    Family History Family History  Problem Relation Age of Onset   Hypertension Mother    Cancer Father     Social History Social History   Tobacco Use   Smoking status: Never   Smokeless tobacco: Never  Vaping Use   Vaping Use: Never used  Substance Use Topics   Alcohol use: No   Drug use: No     Allergies   Codeine and Penicillins   Review of Systems Review of Systems  Constitutional:  Negative for chills, diaphoresis, fatigue and fever.  HENT:  Positive for congestion, rhinorrhea, sinus pressure and sinus pain. Negative for ear pain and sore throat.   Respiratory:  Negative for cough and shortness of breath.   Gastrointestinal:  Negative for abdominal pain, nausea and vomiting.  Musculoskeletal:  Negative for arthralgias and myalgias.  Skin:  Negative for rash.  Neurological:  Positive for headaches. Negative for weakness.  Hematological:  Negative for adenopathy.    Physical Exam Triage Vital Signs ED Triage Vitals  Enc  Vitals Group     BP      Pulse      Resp      Temp      Temp src      SpO2      Weight      Height      Head Circumference      Peak Flow      Pain Score      Pain Loc      Pain Edu?      Excl. in [place]?    No data found.  Updated Vital Signs BP 136/76 (BP Location: Left Arm)   Pulse 85   Temp 98.3 F (36.8 C) (Oral)   Resp 18   Ht '5\' 5"'$  (1.651 m)   Wt 210 lb (95.3 kg)   SpO2 97%   BMI 34.95 kg/m     Physical Exam Vitals and nursing note reviewed.  Constitutional:      General: She is not in acute distress.    Appearance: Normal appearance. She is not ill-appearing or toxic-appearing.  HENT:     Head: Normocephalic and atraumatic.     Right Ear: Tympanic membrane, ear canal and external ear normal.     Left Ear: Tympanic membrane, ear canal and external ear normal.     Nose: Congestion present.     Mouth/Throat:     Mouth: Mucous membranes are moist.     Pharynx: Oropharynx is clear.  Eyes:     General: No scleral icterus.       Right eye: No discharge.        Left eye: No discharge.     Conjunctiva/sclera: Conjunctivae normal.  Cardiovascular:     Rate and Rhythm: Normal rate and regular rhythm.     Heart sounds: Normal heart sounds.  Pulmonary:     Effort: Pulmonary effort is normal. No respiratory distress.     Breath sounds: Normal breath sounds.  Musculoskeletal:     Cervical back: Neck supple.  Skin:    General: Skin is dry.  Neurological:     General: No focal deficit present.     Mental Status: She is alert. Mental status is at baseline.     Motor: No weakness.     Gait: Gait normal.  Psychiatric:        Mood and Affect: Mood normal.        Behavior: Behavior normal.        Thought Content: Thought content normal.     UC Treatments / Results  Labs (all labs ordered are listed, but only abnormal results are displayed) Labs Reviewed - No data to display  EKG   Radiology No results found.  Procedures Procedures (including critical  care time)  Medications Ordered in UC Medications - No data to display  Initial Impression / Assessment and Plan / UC Course  I have reviewed the triage vital signs and the nursing notes.  Pertinent labs & imaging results that were available during my care of the patient were reviewed by me and considered in my medical decision making (see chart for details).  71 year old female presenting for approximately 1 week history of right sinus pressure and headaches with new onset nasal drainage that is yellowish and swelling over the past day.  No associated fever, cough or breathing difficulty.  No COVID exposure.  Vitals normal stable patient overall well-appearing.  Nasal congestion present, more so on the right side than the left side.  No active drainage.  Tenderness palpation of the right maxillary sinus.  Chest clear to auscultation and heart regular rate and rhythm.  Suspect bacterial sinusitis.  Treating with doxycycline as patient has allergic reaction to penicillin.  Also sent Atrovent nasal spray and suggested Sudafed PE.  Reviewed return and ER precautions.   Final Clinical Impressions(s) / UC Diagnoses   Final diagnoses:  Acute maxillary sinusitis, recurrence not specified     Discharge Instructions      -Start Sudafed PE over-the-counter.  Do not take any Tylenol with this medication as it contains Tylenol and a decongestant.  I have also sent a nasal spray and antibiotics to pharmacy for sinus infection.  Should be improving over the next few days.  Finish full course of antibiotics.  Follow-up with PCP or return here if you are not feeling better after you finish it or if symptoms worsen before.     ED Prescriptions     Medication Sig Dispense Auth. Provider   doxycycline (VIBRAMYCIN) 100 MG capsule Take 1 capsule (100 mg total) by mouth 2 (two) times daily for 7 days. 14 capsule Laurene Footman B, PA-C   ipratropium (ATROVENT) 0.06 % nasal spray Place 2 sprays into both  nostrils 4 (four) times daily. 15 mL Danton Clap, PA-C      PDMP not reviewed this encounter.   Danton Clap, PA-C 06/25/21 1226

## 2021-06-25 NOTE — ED Triage Notes (Signed)
Pt c/o headache and left side nasal nurning x1-2weeks.  Pt states that it is draining a yellow fluid that "smells awful"

## 2021-06-25 NOTE — Discharge Instructions (Addendum)
-  Start Sudafed PE over-the-counter.  Do not take any Tylenol with this medication as it contains Tylenol and a decongestant.  I have also sent a nasal spray and antibiotics to pharmacy for sinus infection.  Should be improving over the next few days.  Finish full course of antibiotics.  Follow-up with PCP or return here if you are not feeling better after you finish it or if symptoms worsen before.

## 2021-10-16 DIAGNOSIS — R0609 Other forms of dyspnea: Secondary | ICD-10-CM | POA: Diagnosis not present

## 2021-10-16 DIAGNOSIS — R7303 Prediabetes: Secondary | ICD-10-CM | POA: Diagnosis not present

## 2021-10-16 DIAGNOSIS — F411 Generalized anxiety disorder: Secondary | ICD-10-CM | POA: Diagnosis not present

## 2021-10-16 DIAGNOSIS — Z79899 Other long term (current) drug therapy: Secondary | ICD-10-CM | POA: Diagnosis not present

## 2021-10-16 DIAGNOSIS — Z1231 Encounter for screening mammogram for malignant neoplasm of breast: Secondary | ICD-10-CM | POA: Diagnosis not present

## 2021-10-16 DIAGNOSIS — E559 Vitamin D deficiency, unspecified: Secondary | ICD-10-CM | POA: Diagnosis not present

## 2021-10-16 DIAGNOSIS — M25572 Pain in left ankle and joints of left foot: Secondary | ICD-10-CM | POA: Diagnosis not present

## 2021-10-16 DIAGNOSIS — I10 Essential (primary) hypertension: Secondary | ICD-10-CM | POA: Diagnosis not present

## 2021-10-16 DIAGNOSIS — R079 Chest pain, unspecified: Secondary | ICD-10-CM | POA: Diagnosis not present

## 2021-10-16 DIAGNOSIS — E782 Mixed hyperlipidemia: Secondary | ICD-10-CM | POA: Diagnosis not present

## 2021-10-22 DIAGNOSIS — R058 Other specified cough: Secondary | ICD-10-CM | POA: Diagnosis not present

## 2021-10-22 DIAGNOSIS — R110 Nausea: Secondary | ICD-10-CM | POA: Diagnosis not present

## 2021-10-22 DIAGNOSIS — Z03818 Encounter for observation for suspected exposure to other biological agents ruled out: Secondary | ICD-10-CM | POA: Diagnosis not present

## 2021-10-22 DIAGNOSIS — R0981 Nasal congestion: Secondary | ICD-10-CM | POA: Diagnosis not present

## 2021-10-22 DIAGNOSIS — R1084 Generalized abdominal pain: Secondary | ICD-10-CM | POA: Diagnosis not present

## 2021-10-24 ENCOUNTER — Ambulatory Visit
Admission: EM | Admit: 2021-10-24 | Discharge: 2021-10-24 | Disposition: A | Discharge status: Home / Self Care | Payer: Medicare HMO | Attending: Physician Assistant | Admitting: Physician Assistant

## 2021-10-24 DIAGNOSIS — H6593 Unspecified nonsuppurative otitis media, bilateral: Secondary | ICD-10-CM | POA: Diagnosis not present

## 2021-10-24 DIAGNOSIS — R42 Dizziness and giddiness: Secondary | ICD-10-CM | POA: Diagnosis not present

## 2021-10-24 DIAGNOSIS — R0981 Nasal congestion: Secondary | ICD-10-CM | POA: Diagnosis not present

## 2021-10-24 DIAGNOSIS — B349 Viral infection, unspecified: Secondary | ICD-10-CM

## 2021-10-24 LAB — URINALYSIS, ROUTINE W REFLEX MICROSCOPIC
Bilirubin Urine: NEGATIVE
Glucose, UA: NEGATIVE mg/dL
Hgb urine dipstick: NEGATIVE
Ketones, ur: NEGATIVE mg/dL
Leukocytes,Ua: NEGATIVE
Nitrite: NEGATIVE
Protein, ur: NEGATIVE mg/dL
Specific Gravity, Urine: 1.015 (ref 1.005–1.030)
pH: 5 (ref 5.0–8.0)

## 2021-10-24 MED ORDER — IPRATROPIUM BROMIDE 0.06 % NA SOLN: 2.0000 | Freq: Four times a day (QID) | NASAL | 0 refills | Status: DC

## 2021-10-24 MED ORDER — MECLIZINE HCL 12.5 MG PO TABS: 12.5000 mg | ORAL_TABLET | Freq: Three times a day (TID) | ORAL | 0 refills | Status: AC | PRN

## 2021-10-24 NOTE — Discharge Instructions (Addendum)
-  Your urine test was normal today.  Your vital signs look good. - I was able to review the lab work that you had performed in your viral panel from couple days ago at [place] [facility].  Everything looks good. - You likely have a virus and you have some fluid behind your eardrums which is probably causing you to have the dizziness and dysfunction of your eustachian tubes. - I sent a nasal spray for you to start.  I also sent meclizine to take up to 3 times a day as needed for the dizziness.  If this medication makes you too drowsy, do not take it and try over-the-counter Sudafed. - Go to ER if you ever have any associated chest pain, palpitations, shortness of breath, weakness, return of the abdominal pain or return of the diarrhea, vomiting, etc.

## 2021-10-24 NOTE — ED Triage Notes (Signed)
Pt states that she was seen by her doctor on 10/22/21 for dizziness and diarrhea.   Pt was told that she was fine and was told to come to the urgent care for continued dizziness.   Pt has not taken the prescribed ondansetron.   Pt believes she has a sinus infection.

## 2021-10-24 NOTE — ED Provider Notes (Signed)
[facility]    CSN: [id number] Arrival date & time: 10/24/21  1333      History   Chief Complaint Chief Complaint  Patient presents with   Dizziness    HPI Sandra Dudley is a 71 y.o. female presenting for 4-day history of dizziness. She reports that she was having nausea, dry heaves, diarrhea and lower abdominal pain, but those symptoms have resolved.  She does have continued nasal congestion, postnasal drainage and mild cough.  Denies ear pain, sinus pain, sore throat.  No associated fevers, chest pain, palpitations, presyncope/syncope, or breathing difficulty.  Denies dysuria, frequency or urgency.    Patient was seen at [facility] 2 days ago for the symptoms and prescribed Zofran.  A respiratory panel was performed and she was negative for COVID, flu and RSV.  A CBC was normal.  CMP showed elevated liver enzymes mildly and low creatinine of 53.  This is no real change from patient's baseline.  Patient was prescribed the Zofran but did not take the medication.  HPI  Past Medical History:  Diagnosis Date   Anxiety    Arthritis    Cancer ([place])    skin   Chronic pain    Enlarged heart    High cholesterol    Hypertension     Patient Active Problem List   Diagnosis Date Noted   Shortness of breath 09/26/2019   Anxiety 07/05/2019   Chronic bilateral low back pain 07/05/2019   Obesity (BMI 35.0-39.9 without comorbidity) 07/05/2019   Dermatitis 07/05/2019   Essential hypertension 07/05/2019   Tremor 07/05/2019    Past Surgical History:  Procedure Laterality Date   BACK SURGERY     KNEE SURGERY     SHOULDER SURGERY      OB History   No obstetric history on file.      Home Medications    Prior to Admission medications   Medication Sig Start Date End Date Taking? Authorizing Provider  ALPRAZolam (XANAX) 0.25 MG tablet Take 1 tablet (0.25 mg total) by mouth 2 (two) times daily as needed. 12/31/19  Yes Masoud, Viann Shove, MD  fenofibrate (TRICOR) 145  MG tablet Take 145 mg by mouth daily.   Yes [provider]  ipratropium (ATROVENT) 0.06 % nasal spray Place 2 sprays into both nostrils 4 (four) times daily. 10/24/21  Yes Danton Clap, PA-C  lisinopril-hydrochlorothiazide (ZESTORETIC) 20-12.5 MG tablet Take 1 tablet by mouth daily. 12/31/19  Yes Masoud, Viann Shove, MD  meclizine (ANTIVERT) 12.5 MG tablet Take 1 tablet (12.5 mg total) by mouth 3 (three) times daily as needed for up to 7 days for dizziness. 10/24/21 10/31/21 Yes Laurene Footman B, PA-C  meloxicam (MOBIC) 7.5 MG tablet TAKE 1 TABLET BY MOUTH ONCE DAILY 07/09/19  Yes Masoud, Viann Shove, MD  omeprazole (PRILOSEC) 40 MG capsule TAKE 1 CAPSULE BY MOUTH ONCE DAILY 01/11/20  Yes Beckie Salts, FNP  venlafaxine (EFFEXOR) 75 MG tablet Take 75 mg by mouth 2 (two) times daily.   Yes [provider]  albuterol (PROVENTIL HFA;VENTOLIN HFA) 108 (90 Base) MCG/ACT inhaler Inhale 1 puff into the lungs every 6 (six) hours as needed for wheezing or shortness of breath.    [provider]  budesonide-formoterol (SYMBICORT) 80-4.5 MCG/ACT inhaler Inhale 2 puffs into the lungs 2 (two) times daily.    [provider]    Family History Family History  Problem Relation Age of Onset   Hypertension Mother    Cancer Father  Social History Social History   Tobacco Use   Smoking status: Never   Smokeless tobacco: Never  Vaping Use   Vaping Use: Never used  Substance Use Topics   Alcohol use: No   Drug use: No     Allergies   Prednisone, Codeine, and Penicillins   Review of Systems Review of Systems  Constitutional:  Positive for fatigue. Negative for chills, diaphoresis and fever.  HENT:  Positive for congestion, postnasal drip and rhinorrhea. Negative for ear pain, sinus pressure, sinus pain and sore throat.   Respiratory:  Positive for cough. Negative for shortness of breath.   Cardiovascular:  Negative for chest pain and palpitations.  Gastrointestinal:   Negative for abdominal pain (resolved), diarrhea (resolved), nausea (resolved) and vomiting.  Musculoskeletal:  Negative for arthralgias and myalgias.  Skin:  Negative for rash.  Neurological:  Positive for dizziness. Negative for weakness and headaches.  Hematological:  Negative for adenopathy.     Physical Exam Triage Vital Signs ED Triage Vitals  Enc Vitals Group     BP      Pulse      Resp      Temp      Temp src      SpO2      Weight      Height      Head Circumference      Peak Flow      Pain Score      Pain Loc      Pain Edu?      Excl. in [place]?    No data found.  Updated Vital Signs BP (!) 116/57 (BP Location: Left Arm)   Pulse 78   Temp 98.2 F (36.8 C) (Oral)   Resp 18   Ht '5\' 5"'$  (1.651 m)   Wt 202 lb (91.6 kg)   SpO2 97%   BMI 33.61 kg/m      Physical Exam Vitals and nursing note reviewed.  Constitutional:      General: She is not in acute distress.    Appearance: Normal appearance. She is not ill-appearing or toxic-appearing.  HENT:     Head: Normocephalic and atraumatic.     Right Ear: A middle ear effusion is present. Tympanic membrane is bulging.     Left Ear: A middle ear effusion is present.     Nose: Mucosal edema and congestion present.     Mouth/Throat:     Mouth: Mucous membranes are moist.     Pharynx: Oropharynx is clear.  Eyes:     General: No scleral icterus.       Right eye: No discharge.        Left eye: No discharge.     Conjunctiva/sclera: Conjunctivae normal.  Cardiovascular:     Rate and Rhythm: Normal rate and regular rhythm.     Heart sounds: Normal heart sounds.  Pulmonary:     Effort: Pulmonary effort is normal. No respiratory distress.     Breath sounds: Normal breath sounds.  Abdominal:     Palpations: Abdomen is soft.     Tenderness: There is no abdominal tenderness. There is no right CVA tenderness or left CVA tenderness.  Musculoskeletal:     Cervical back: Neck supple.  Skin:    General: Skin is dry.   Neurological:     General: No focal deficit present.     Mental Status: She is alert and oriented to person, place, and time. Mental status is at baseline.  Cranial Nerves: No cranial nerve deficit.     Motor: No weakness.     Gait: Gait normal.  Psychiatric:        Mood and Affect: Mood normal.        Behavior: Behavior normal.        Thought Content: Thought content normal.      UC Treatments / Results  Labs (all labs ordered are listed, but only abnormal results are displayed) Labs Reviewed  URINALYSIS, [place] MICROSCOPIC    EKG   Radiology No results found.  Procedures Procedures (including critical care time)  Medications Ordered in UC Medications - No data to display  Initial Impression / Assessment and Plan / UC Course  I have reviewed the triage vital signs and the nursing notes.  Pertinent labs & imaging results that were available during my care of the patient were reviewed by me and considered in my medical decision making (see chart for details).   71 year old female presents for 4-day history of dizziness, postnasal drainage, dry cough, congestion.  She was having lower abdominal pain, diarrhea and dry heaving but those symptoms have resolved.  Patient seen at [facility] 2 days ago and had labs performed.  CBC and CMP reassuring.  CMP is no change from baseline.  CBC was normal and respiratory panel was negative for flu, COVID and RSV.  Patient prescribed Zofran, but did not try it.  *I reviewed patient's encounter from 10/22/2021 at [facility].  Vitals are all normal and stable.  She is overall well-appearing.  Gait is normal.  Cranial nerve exam is normal.  She has some nasal congestion and clear effusion of bilateral TMs and bulging of the right TM.  Throat is clear.  Chest clear to station heart regular rate and rhythm.  Abdomen is soft and nontender.  Urinalysis performed today is within normal limits.  Patient without signs of UTI  and appears to be adequately hydrated.  Discussed with patient I believe her dizziness is likely due to eustachian tube dysfunction and otitis media with effusion.  Suggested Atrovent nasal spray, low-dose meclizine, plenty of rest and fluids, careful fold changing positions.  Advised patient to go to [place] if she ever has any associated chest pain, palpitations, abdominal pain, recurrent excessive vomiting or diarrhea, breathing difficulty, etc.  Otherwise, advised her to make a follow-up appoint with her PCP this coming week.   Final Clinical Impressions(s) / UC Diagnoses   Final diagnoses:  Dizziness  Otitis media with effusion, bilateral  Nasal congestion  Viral illness     Discharge Instructions      -Your urine test was normal today.  Your vital signs look good. - I was able to review the lab work that you had performed in your viral panel from couple days ago at [place] clinic.  Everything looks good. - You likely have a virus and you have some fluid behind your eardrums which is probably causing you to have the dizziness and dysfunction of your eustachian tubes. - I sent a nasal spray for you to start.  I also sent meclizine to take up to 3 times a day as needed for the dizziness.  If this medication makes you too drowsy, do not take it and try over-the-counter Sudafed. - Go to ER if you ever have any associated chest pain, palpitations, shortness of breath, weakness, return of the abdominal pain or return of the diarrhea, vomiting, etc.     ED Prescriptions  Medication Sig Dispense Auth. Provider   ipratropium (ATROVENT) 0.06 % nasal spray Place 2 sprays into both nostrils 4 (four) times daily. 15 mL Laurene Footman B, PA-C   meclizine (ANTIVERT) 12.5 MG tablet Take 1 tablet (12.5 mg total) by mouth 3 (three) times daily as needed for up to 7 days for dizziness. 20 tablet Gretta Cool      PDMP not reviewed this encounter.   Danton Clap,  PA-C 10/24/21 1507

## 2021-10-24 NOTE — ED Triage Notes (Signed)
Pt states that her diarrhea and dry heaving has stopped.   Pt states that she is not having any urinary symptoms or abdominal pain.   Pt fell today while walking around her house. She states that she tripped over an item and was caught by her son.

## 2021-11-03 DIAGNOSIS — I10 Essential (primary) hypertension: Secondary | ICD-10-CM | POA: Diagnosis not present

## 2021-11-03 DIAGNOSIS — E782 Mixed hyperlipidemia: Secondary | ICD-10-CM | POA: Diagnosis not present

## 2021-11-03 DIAGNOSIS — R0789 Other chest pain: Secondary | ICD-10-CM | POA: Diagnosis not present

## 2022-02-11 ENCOUNTER — Ambulatory Visit: Payer: Medicare HMO

## 2022-02-23 ENCOUNTER — Other Ambulatory Visit: Payer: Self-pay | Admitting: Internal Medicine

## 2022-02-23 DIAGNOSIS — Z1231 Encounter for screening mammogram for malignant neoplasm of breast: Secondary | ICD-10-CM

## 2022-04-14 DIAGNOSIS — L821 Other seborrheic keratosis: Secondary | ICD-10-CM | POA: Diagnosis not present

## 2022-04-14 DIAGNOSIS — L578 Other skin changes due to chronic exposure to nonionizing radiation: Secondary | ICD-10-CM | POA: Diagnosis not present

## 2022-04-14 DIAGNOSIS — L570 Actinic keratosis: Secondary | ICD-10-CM | POA: Diagnosis not present

## 2022-04-23 ENCOUNTER — Ambulatory Visit (INDEPENDENT_AMBULATORY_CARE_PROVIDER_SITE_OTHER): Payer: Medicare HMO

## 2022-04-23 ENCOUNTER — Ambulatory Visit
Admission: EM | Admit: 2022-04-23 | Discharge: 2022-04-23 | Disposition: A | Discharge status: Home / Self Care | Payer: Medicare HMO | Attending: Physician Assistant | Admitting: Physician Assistant

## 2022-04-23 ENCOUNTER — Encounter: Payer: Self-pay | Admitting: Emergency Medicine

## 2022-04-23 DIAGNOSIS — R1031 Right lower quadrant pain: Secondary | ICD-10-CM | POA: Diagnosis not present

## 2022-04-23 DIAGNOSIS — G8929 Other chronic pain: Secondary | ICD-10-CM | POA: Diagnosis not present

## 2022-04-23 DIAGNOSIS — M5416 Radiculopathy, lumbar region: Secondary | ICD-10-CM | POA: Diagnosis not present

## 2022-04-23 DIAGNOSIS — M1611 Unilateral primary osteoarthritis, right hip: Secondary | ICD-10-CM | POA: Diagnosis not present

## 2022-04-23 DIAGNOSIS — M5441 Lumbago with sciatica, right side: Secondary | ICD-10-CM | POA: Diagnosis not present

## 2022-04-23 MED ORDER — KETOROLAC TROMETHAMINE 30 MG/ML IJ SOLN
30.0000 mg | Freq: Once | INTRAMUSCULAR | Status: AC
  Administered 2022-04-23: 30 mg via INTRAMUSCULAR

## 2022-04-23 MED ORDER — MELOXICAM 7.5 MG PO TABS
7.5000 mg | ORAL_TABLET | Freq: Every day | ORAL | 0 refills | Status: AC
Start: 2022-04-23 — End: 2022-05-23

## 2022-04-23 NOTE — ED Provider Notes (Signed)
[facility]    CSN: [id number] Arrival date & time: 04/23/22  1041      History   Chief Complaint Chief Complaint  Patient presents with   Leg Pain    Right upper    HPI Sandra Dudley is a 72 y.o. female presenting for 6-month history of right-sided groin and thigh pain.  She has a history of sciatica particularly on the side.  States she thinks is sciatica is flared up.  Occasionally takes meloxicam but ran out.  Has been taking Tylenol without much relief.  Reports significant pain at night when trying to roll over.  Says she cannot get comfortable.  No reported numbness, weakness or tingling, loss of bowel or bladder control. History of back surgery; L3-L4 and L4-L5 fusion.   HPI  Past Medical History:  Diagnosis Date   Anxiety    Arthritis    Cancer ([place])    skin   Chronic pain    Enlarged heart    High cholesterol    Hypertension     Patient Active Problem List   Diagnosis Date Noted   Shortness of breath 09/26/2019   Anxiety 07/05/2019   Chronic bilateral low back pain 07/05/2019   Obesity (BMI 35.0-39.9 without comorbidity) 07/05/2019   Dermatitis 07/05/2019   Essential hypertension 07/05/2019   Tremor 07/05/2019    Past Surgical History:  Procedure Laterality Date   BACK SURGERY     KNEE SURGERY     SHOULDER SURGERY      OB History   No obstetric history on file.      Home Medications    Prior to Admission medications   Medication Sig Start Date End Date Taking? Authorizing Provider  meloxicam (MOBIC) 7.5 MG tablet Take 1 tablet (7.5 mg total) by mouth daily. 04/23/22 05/23/22 Yes Laurene Footman B, PA-C  albuterol (PROVENTIL HFA;VENTOLIN HFA) 108 (90 Base) MCG/ACT inhaler Inhale 1 puff into the lungs every 6 (six) hours as needed for wheezing or shortness of breath.    [provider]  ALPRAZolam Duanne Moron) 0.25 MG tablet Take 1 tablet (0.25 mg total) by mouth 2 (two) times daily as needed. 12/31/19   Cletis Athens, MD   budesonide-formoterol (SYMBICORT) 80-4.5 MCG/ACT inhaler Inhale 2 puffs into the lungs 2 (two) times daily.    [provider]  fenofibrate (TRICOR) 145 MG tablet Take 145 mg by mouth daily.    [provider]  ipratropium (ATROVENT) 0.06 % nasal spray Place 2 sprays into both nostrils 4 (four) times daily. 10/24/21   Danton Clap, PA-C  lisinopril-hydrochlorothiazide (ZESTORETIC) 20-12.5 MG tablet Take 1 tablet by mouth daily. 12/31/19   Cletis Athens, MD  omeprazole (PRILOSEC) 40 MG capsule TAKE 1 CAPSULE BY MOUTH ONCE DAILY 01/11/20   Beckie Salts, FNP  venlafaxine (EFFEXOR) 75 MG tablet Take 75 mg by mouth 2 (two) times daily.    [provider]    Family History Family History  Problem Relation Age of Onset   Hypertension Mother    Cancer Father     Social History Social History   Tobacco Use   Smoking status: Never   Smokeless tobacco: Never  Vaping Use   Vaping Use: Never used  Substance Use Topics   Alcohol use: No   Drug use: No     Allergies   Prednisone, Codeine, and Penicillins   Review of Systems Review of Systems  Genitourinary:  Negative for dysuria and flank pain.  Musculoskeletal:  Positive  for arthralgias and back pain. Negative for gait problem and joint swelling.  Neurological:  Negative for weakness and numbness.     Physical Exam Triage Vital Signs ED Triage Vitals  Enc Vitals Group     BP      Pulse      Resp      Temp      Temp src      SpO2      Weight      Height      Head Circumference      Peak Flow      Pain Score      Pain Loc      Pain Edu?      Excl. in [place]?    No data found.  Updated Vital Signs BP (!) 172/76 (BP Location: Right Arm)   Pulse 94   Temp 98 F (36.7 C) (Oral)   Resp 14   Ht 5\' 5"  (1.651 m)   Wt 201 lb 15.1 oz (91.6 kg)   SpO2 94%   BMI 33.60 kg/m    Physical Exam Vitals and nursing note reviewed.  Constitutional:      General: She is not in acute distress.     Appearance: Normal appearance. She is not ill-appearing or toxic-appearing.  HENT:     Head: Normocephalic and atraumatic.  Eyes:     General: No scleral icterus.       Right eye: No discharge.        Left eye: No discharge.     Conjunctiva/sclera: Conjunctivae normal.  Cardiovascular:     Rate and Rhythm: Normal rate and regular rhythm.     Heart sounds: Normal heart sounds.  Pulmonary:     Effort: Pulmonary effort is normal. No respiratory distress.     Breath sounds: Normal breath sounds.  Abdominal:     Palpations: Abdomen is soft.     Tenderness: There is no abdominal tenderness.  Musculoskeletal:     Cervical back: Neck supple.     Lumbar back: Bony tenderness (lateral hip and right paralumbar muscles mildly) present. Decreased range of motion. Positive right straight leg raise test. Negative left straight leg raise test.  Skin:    General: Skin is dry.  Neurological:     General: No focal deficit present.     Mental Status: She is alert. Mental status is at baseline.     Motor: No weakness.     Gait: Gait normal.  Psychiatric:        Mood and Affect: Mood normal.        Behavior: Behavior normal.        Thought Content: Thought content normal.      UC Treatments / Results  Labs (all labs ordered are listed, but only abnormal results are displayed) Labs Reviewed - No data to display  EKG   Radiology DG Hip Unilat W or Wo Pelvis 2-3 Views Right  Result Date: 04/23/2022 CLINICAL DATA:  Right hip and thigh pain for 1-2 months. EXAM: DG HIP (WITH OR WITHOUT PELVIS) 2-3V RIGHT COMPARISON:  CT abdomen/pelvis 09/03/2020 FINDINGS: There is no evidence of acute osseous abnormality. Bony alignment is normal. There is mild degenerative change about both hips, left slightly worse than right. There is no erosive change. The SI joints and symphysis pubis are intact. Postsurgical changes are noted in the lower lumbar spine. IMPRESSION: Mild degenerative change about the hips,  left worse than right. No acute finding. Electronically Signed  By: Valetta Mole M.D.   On: 04/23/2022 11:38    Procedures Procedures (including critical care time)  Medications Ordered in UC Medications  ketorolac (TORADOL) 30 MG/ML injection 30 mg (has no administration in time range)    Initial Impression / Assessment and Plan / UC Course  I have reviewed the triage vital signs and the nursing notes.  Pertinent labs & imaging results that were available during my care of the patient were reviewed by me and considered in my medical decision making (see chart for details).   72 year old female with history of chronic back pain and radiculopathy as well as previous back surgery, spinal fusion presents for right groin and thigh pain.  Pain does not radiate beyond the knee.  No numbness, weakness or tingling and no loss of bowel or bladder control.  Occasionally takes meloxicam but ran out would like a refill.  Has been taking Tylenol without relief.  No other symptoms.  X-ray of the hip obtained shows mild degenerative change of the hips.  Presentation consistent with lumbar radiculopathy.  Refilled 7.5 mg meloxicam for patient.  Advised to take Tylenol for pain relief.  She was given 30 mg IM ketorolac in clinic for acute pain relief since she states her pain is currently 7 out of 10.  She does have a spine specialist/pain management provider whom she will get in contact with.  Has benefited from corticosteroid injections into her spine.  Advised she may need another 1 at this time since her pain has been ongoing for the past 2 months.  She is agreeable.  ED precautions discussed.   Final Clinical Impressions(s) / UC Diagnoses   Final diagnoses:  Lumbar radiculopathy  Right groin pain  Chronic low back pain with right-sided sciatica, unspecified back pain laterality     Discharge Instructions      -The x-ray of your hip shows mild hip arthritis. - Your pain is likely related to  your back. - We gave you an injection of a anti-inflammatory medication called ketorolac and I refilled your Mobic.  If you need to take the Mobic wait until tomorrow.  May take Tylenol and use muscle rubs, lidocaine. - Reach out to your spine specialist/pain clinic as you may benefit from another injection into your spine to help with the pain. - Go to ER if you lose control of your bowels, bladder or have leg weakness.     ED Prescriptions     Medication Sig Dispense Auth. Provider   meloxicam (MOBIC) 7.5 MG tablet Take 1 tablet (7.5 mg total) by mouth daily. 30 tablet Danton Clap, PA-C      I have reviewed the PDMP during this encounter.   Danton Clap, PA-C 04/23/22 1202

## 2022-04-23 NOTE — ED Triage Notes (Signed)
Patient right sided groin pain and right upper leg pain for a month.  Patient denies fall or injury.  Patient has only been taking Tylenol for the pain.

## 2022-04-23 NOTE — Discharge Instructions (Addendum)
-  The x-ray of your hip shows mild hip arthritis. - Your pain is likely related to your back. - We gave you an injection of a anti-inflammatory medication called ketorolac and I refilled your Mobic.  If you need to take the Mobic wait until tomorrow.  May take Tylenol and use muscle rubs, lidocaine. - Reach out to your spine specialist/pain clinic as you may benefit from another injection into your spine to help with the pain. - Go to ER if you lose control of your bowels, bladder or have leg weakness.

## 2022-05-18 DIAGNOSIS — Z0100 Encounter for examination of eyes and vision without abnormal findings: Secondary | ICD-10-CM | POA: Diagnosis not present

## 2022-06-04 DIAGNOSIS — F419 Anxiety disorder, unspecified: Secondary | ICD-10-CM | POA: Diagnosis not present

## 2022-06-04 DIAGNOSIS — Z79899 Other long term (current) drug therapy: Secondary | ICD-10-CM | POA: Diagnosis not present

## 2022-06-04 DIAGNOSIS — Z1231 Encounter for screening mammogram for malignant neoplasm of breast: Secondary | ICD-10-CM | POA: Diagnosis not present

## 2022-06-04 DIAGNOSIS — I10 Essential (primary) hypertension: Secondary | ICD-10-CM | POA: Diagnosis not present

## 2022-06-04 DIAGNOSIS — R7303 Prediabetes: Secondary | ICD-10-CM | POA: Diagnosis not present

## 2022-06-04 DIAGNOSIS — E559 Vitamin D deficiency, unspecified: Secondary | ICD-10-CM | POA: Diagnosis not present

## 2022-06-04 DIAGNOSIS — E785 Hyperlipidemia, unspecified: Secondary | ICD-10-CM | POA: Diagnosis not present

## 2022-06-07 ENCOUNTER — Other Ambulatory Visit: Payer: Self-pay | Admitting: Internal Medicine

## 2022-06-07 DIAGNOSIS — Z1231 Encounter for screening mammogram for malignant neoplasm of breast: Secondary | ICD-10-CM

## 2022-06-25 DIAGNOSIS — R7303 Prediabetes: Secondary | ICD-10-CM | POA: Diagnosis not present

## 2022-06-25 DIAGNOSIS — I10 Essential (primary) hypertension: Secondary | ICD-10-CM | POA: Diagnosis not present

## 2022-06-25 DIAGNOSIS — E559 Vitamin D deficiency, unspecified: Secondary | ICD-10-CM | POA: Diagnosis not present

## 2022-06-25 DIAGNOSIS — E782 Mixed hyperlipidemia: Secondary | ICD-10-CM | POA: Diagnosis not present

## 2022-06-25 DIAGNOSIS — Z1211 Encounter for screening for malignant neoplasm of colon: Secondary | ICD-10-CM | POA: Diagnosis not present

## 2022-07-23 ENCOUNTER — Ambulatory Visit
Admission: RE | Admit: 2022-07-23 | Discharge: 2022-07-23 | Disposition: A | Discharge status: Home / Self Care | Payer: Medicare HMO | Source: Ambulatory Visit | Attending: Internal Medicine | Admitting: Internal Medicine

## 2022-07-23 DIAGNOSIS — Z1231 Encounter for screening mammogram for malignant neoplasm of breast: Secondary | ICD-10-CM | POA: Insufficient documentation

## 2022-08-06 ENCOUNTER — Ambulatory Visit: Payer: Medicare HMO

## 2022-08-06 DIAGNOSIS — K5730 Diverticulosis of large intestine without perforation or abscess without bleeding: Secondary | ICD-10-CM | POA: Diagnosis not present

## 2022-08-06 DIAGNOSIS — Z1211 Encounter for screening for malignant neoplasm of colon: Secondary | ICD-10-CM | POA: Diagnosis not present

## 2022-09-24 DIAGNOSIS — I10 Essential (primary) hypertension: Secondary | ICD-10-CM | POA: Diagnosis not present

## 2022-09-24 DIAGNOSIS — E782 Mixed hyperlipidemia: Secondary | ICD-10-CM | POA: Diagnosis not present

## 2022-09-24 DIAGNOSIS — E559 Vitamin D deficiency, unspecified: Secondary | ICD-10-CM | POA: Diagnosis not present

## 2022-09-24 DIAGNOSIS — F411 Generalized anxiety disorder: Secondary | ICD-10-CM | POA: Diagnosis not present

## 2022-09-24 DIAGNOSIS — Z79899 Other long term (current) drug therapy: Secondary | ICD-10-CM | POA: Diagnosis not present

## 2022-09-24 DIAGNOSIS — R7303 Prediabetes: Secondary | ICD-10-CM | POA: Diagnosis not present

## 2022-09-24 DIAGNOSIS — Z0000 Encounter for general adult medical examination without abnormal findings: Secondary | ICD-10-CM | POA: Diagnosis not present

## 2022-12-31 DIAGNOSIS — Z79899 Other long term (current) drug therapy: Secondary | ICD-10-CM | POA: Diagnosis not present

## 2022-12-31 DIAGNOSIS — E559 Vitamin D deficiency, unspecified: Secondary | ICD-10-CM | POA: Diagnosis not present

## 2022-12-31 DIAGNOSIS — E785 Hyperlipidemia, unspecified: Secondary | ICD-10-CM | POA: Diagnosis not present

## 2022-12-31 DIAGNOSIS — R7303 Prediabetes: Secondary | ICD-10-CM | POA: Diagnosis not present

## 2022-12-31 DIAGNOSIS — E782 Mixed hyperlipidemia: Secondary | ICD-10-CM | POA: Diagnosis not present

## 2022-12-31 DIAGNOSIS — I10 Essential (primary) hypertension: Secondary | ICD-10-CM | POA: Diagnosis not present

## 2022-12-31 DIAGNOSIS — Z0000 Encounter for general adult medical examination without abnormal findings: Secondary | ICD-10-CM | POA: Diagnosis not present

## 2023-03-12 ENCOUNTER — Ambulatory Visit
Admission: EM | Admit: 2023-03-12 | Discharge: 2023-03-12 | Disposition: A | Discharge status: Home / Self Care | Payer: Medicare HMO | Attending: Emergency Medicine | Admitting: Emergency Medicine

## 2023-03-12 DIAGNOSIS — T7840XA Allergy, unspecified, initial encounter: Secondary | ICD-10-CM

## 2023-03-12 DIAGNOSIS — M545 Low back pain, unspecified: Secondary | ICD-10-CM | POA: Diagnosis not present

## 2023-03-12 DIAGNOSIS — G8929 Other chronic pain: Secondary | ICD-10-CM

## 2023-03-12 LAB — URINALYSIS, W/ REFLEX TO CULTURE (INFECTION SUSPECTED)
Bilirubin Urine: NEGATIVE
Glucose, UA: NEGATIVE mg/dL
Hgb urine dipstick: NEGATIVE
Ketones, ur: NEGATIVE mg/dL
Nitrite: NEGATIVE
Protein, ur: NEGATIVE mg/dL
Specific Gravity, Urine: 1.015 (ref 1.005–1.030)
pH: 6 (ref 5.0–8.0)

## 2023-03-12 LAB — GROUP A STREP BY PCR: Group A Strep by PCR: NOT DETECTED

## 2023-03-12 NOTE — ED Triage Notes (Signed)
 Pt c/o sore throat x3days and left upper abdominal pain x1day  Pt was around her great granddaughter who was sick.   Pt asks for a strep test to be done  Pt has a history of diverticulitis  Pt asks for her kidneys to be checked, Pt denies any urinary issues or pain.

## 2023-03-12 NOTE — ED Provider Notes (Signed)
 [facility] [place] [facility] - [place]   Name: Sandra Dudley DOB: [birth year] MRN: [id number] CSN: [id number] PCP: Marguarite Arbour, MD  Arrival date and time:  03/12/23 1057  Chief Complaint:  Sore Throat   NOTE: Prior to seeing the patient today, I have reviewed the triage nursing documentation and vital signs. Clinical staff has updated patient's PMH/PSHx, current medication list, and drug allergies/intolerances to ensure comprehensive history available to assist in medical decision making.   History:   HPI: Sandra Dudley is a 73 y.o. female who presents today alone with complaints of left-sided back pain and scratchy throat.  Patient states she awoke this morning with left-sided lower back pain.  She has not taken anything to help the pain.  She has had she has pain similar to this and she was diagnosed with kidney stones shortly after.  She denies any urinary symptoms.  Of note, patient says she did have a "hernia" surgery to that area about 15 years ago and she does notice a bulge when she is straining her abdomen.  Denies any GI symptoms. Patient also has noticed a dry cough and scratchy throat for the past week.  She has not take anything for the symptoms as well.  Her great granddaughter had a cold recently.  She denies any additional symptoms such as shortness of breath, ear or throat pain, or fevers.   Past Medical History:  Diagnosis Date   Anxiety    Arthritis    Cancer (HCC)    skin   Chronic pain    Enlarged heart    High cholesterol    Hypertension     Past Surgical History:  Procedure Laterality Date   ABDOMINAL HYSTERECTOMY     BACK SURGERY     KNEE SURGERY     SHOULDER SURGERY      Family History  Problem Relation Age of Onset   Hypertension Mother    Cancer Father     Social History   Tobacco Use   Smoking status: Never   Smokeless tobacco: Never  Vaping Use   Vaping status: Never Used  Substance Use Topics   Alcohol use: No   Drug use:  No    Patient Active Problem List   Diagnosis Date Noted   Shortness of breath 09/26/2019   Anxiety 07/05/2019   Chronic bilateral low back pain 07/05/2019   Obesity (BMI 35.0-39.9 without comorbidity) 07/05/2019   Dermatitis 07/05/2019   Essential hypertension 07/05/2019   Tremor 07/05/2019    Home Medications:    Current Meds  Medication Sig   albuterol (PROVENTIL HFA;VENTOLIN HFA) 108 (90 Base) MCG/ACT inhaler Inhale 1 puff into the lungs every 6 (six) hours as needed for wheezing or shortness of breath.   ALPRAZolam (XANAX) 0.25 MG tablet Take 1 tablet (0.25 mg total) by mouth 2 (two) times daily as needed.   budesonide-formoterol (SYMBICORT) 80-4.5 MCG/ACT inhaler Inhale 2 puffs into the lungs 2 (two) times daily.   fenofibrate (TRICOR) 145 MG tablet Take 145 mg by mouth daily.   ipratropium (ATROVENT) 0.06 % nasal spray Place 2 sprays into both nostrils 4 (four) times daily.   lisinopril-hydrochlorothiazide (ZESTORETIC) 20-12.5 MG tablet Take 1 tablet by mouth daily.   omeprazole (PRILOSEC) 40 MG capsule TAKE 1 CAPSULE BY MOUTH ONCE DAILY   venlafaxine (EFFEXOR) 75 MG tablet Take 75 mg by mouth 2 (two) times daily.    Allergies:   Prednisone, Codeine, and Penicillins  Review of Systems (  ROS): Review of Systems  Constitutional:  Negative for chills, diaphoresis, fatigue and fever.  HENT:  Negative for sinus pain, sore throat and voice change.   Respiratory:  Positive for cough. Negative for apnea, shortness of breath and wheezing.   Gastrointestinal:  Negative for abdominal pain, diarrhea, nausea and vomiting.  Genitourinary:  Positive for flank pain. Negative for difficulty urinating, dyspareunia, dysuria, frequency, hematuria and pelvic pain.     Vital Signs: Today's Vitals   03/12/23 1204 03/12/23 1205 03/12/23 1206 03/12/23 1257  BP:   (P) 125/66   Pulse:   72   Resp:   12   Temp:   97.8 F (36.6 C)   TempSrc:   Oral   SpO2:   97%   Weight:  220 lb (99.8 kg)     Height:  5\' 5"  (1.651 m)    PainSc: 9    9     Physical Exam: Physical Exam Vitals and nursing note reviewed.  Constitutional:      Appearance: Normal appearance.  HENT:     Right Ear: Tympanic membrane normal.     Left Ear: Tympanic membrane normal.     Nose:     Right Sinus: No maxillary sinus tenderness or frontal sinus tenderness.     Left Sinus: No maxillary sinus tenderness or frontal sinus tenderness.     Mouth/Throat:     Pharynx: Oropharynx is clear. Postnasal drip present. No posterior oropharyngeal erythema.     Tonsils: No tonsillar exudate.  Cardiovascular:     Rate and Rhythm: Normal rate and regular rhythm.     Pulses: Normal pulses.     Heart sounds: Normal heart sounds.  Pulmonary:     Breath sounds: Normal breath sounds.  Abdominal:     Tenderness: There is no abdominal tenderness. There is no right CVA tenderness or left CVA tenderness.     Comments: Bulge to left lower back when patient strains her abdomen noted.  Bulging area is not painful per patient report.  Skin:    General: Skin is warm and dry.  Neurological:     Mental Status: She is alert.  Psychiatric:        Mood and Affect: Mood normal.        Behavior: Behavior normal.        Thought Content: Thought content normal.      Urgent Care Treatments / Results:   LABS: PLEASE NOTE: all labs that were ordered this encounter are listed, however only abnormal results are displayed. Labs Reviewed  URINALYSIS, W/ REFLEX TO CULTURE (INFECTION SUSPECTED) - Abnormal; Notable for the following components:      Result Value   Leukocytes,Ua TRACE (*)    Bacteria, UA FEW (*)    All other components within normal limits  GROUP A STREP BY PCR    EKG: -None  RADIOLOGY: No results found.  PROCEDURES: Procedures  MEDICATIONS RECEIVED THIS VISIT: Medications - No data to display  PERTINENT CLINICAL COURSE NOTES/UPDATES:   Initial Impression / Assessment and Plan / Urgent Care Course:   Pertinent labs & imaging results that were available during my care of the patient were personally reviewed by me and considered in my medical decision making (see lab/imaging section of note for values and interpretations).  Sandra Dudley is a 73 y.o. female who presents to [facility] today with complaints of back pain and scratchy throat, diagnosed with back pain and allergies, and treated as such with the  directions below. NP and patient reviewed discharge instructions below during visit.   Patient is well appearing overall in clinic today. She does not appear to be in any acute distress. Presenting symptoms (see HPI) and exam as documented above.   I have reviewed the follow up and strict return precautions for any new or worsening symptoms. Patient is aware of symptoms that would be deemed urgent/emergent, and would thus require further evaluation either here or in the emergency department. At the time of discharge, she verbalized understanding and consent with the discharge plan as it was reviewed with her. All questions were fielded by provider and/or clinic staff prior to patient discharge.    Final Clinical Impressions / Urgent Care Diagnoses:   Final diagnoses:  Chronic left-sided low back pain without sciatica  Allergy, initial encounter    New Prescriptions:  [place] Controlled Substance Registry consulted? Not Applicable  No orders of the defined types were placed in this encounter.     Discharge Instructions      You were seen for back pain and scratchy throat and are being treated for chronic back pain and allergies.   -Your strep test was negative; your urine did not show any signs of infection. -Follow-up with your primary care provider about the back bulge.  Further imaging may be necessary. -Start an over-the-counter allergy medication such as Allegra daily to help with your scratchy throat symptoms.  Take care, Dr. Sharlet Salina, NP-c     Recommended  Follow up Care:  Patient encouraged to follow up with the following provider within the specified time frame, or sooner as dictated by the severity of her symptoms. As always, she was instructed that for any urgent/emergent care needs, she should seek care either here or in the emergency department for more immediate evaluation.   Bailey Mech, DNP, NP-c   Bailey Mech, NP 03/12/23 1340

## 2023-03-12 NOTE — Discharge Instructions (Signed)
 You were seen for back pain and scratchy throat and are being treated for chronic back pain and allergies.   -Your strep test was negative; your urine did not show any signs of infection. -Follow-up with your primary care provider about the back bulge.  Further imaging may be necessary. -Start an over-the-counter allergy medication such as Allegra daily to help with your scratchy throat symptoms.  Take care, Dr. Sharlet Salina, NP-c

## 2023-06-28 ENCOUNTER — Ambulatory Visit
Admission: EM | Admit: 2023-06-28 | Discharge: 2023-06-28 | Disposition: A | Discharge status: Home / Self Care | Attending: Emergency Medicine | Admitting: Emergency Medicine

## 2023-06-28 DIAGNOSIS — I951 Orthostatic hypotension: Secondary | ICD-10-CM | POA: Diagnosis not present

## 2023-06-28 DIAGNOSIS — R42 Dizziness and giddiness: Secondary | ICD-10-CM | POA: Diagnosis not present

## 2023-06-28 DIAGNOSIS — H81391 Other peripheral vertigo, right ear: Secondary | ICD-10-CM

## 2023-06-28 LAB — GLUCOSE, CAPILLARY: Glucose-Capillary: 108 mg/dL — ABNORMAL HIGH (ref 70–99)

## 2023-06-28 MED ORDER — MECLIZINE HCL 25 MG PO TABS: 25.0000 mg | ORAL_TABLET | Freq: Three times a day (TID) | ORAL | 0 refills | Status: AC | PRN

## 2023-06-28 NOTE — ED Triage Notes (Addendum)
 Patient states that she had some abdominal pain yesterday that has gone. Dizziness started this morning. No nausea or vomiting. Patient has taken meclizine  before for dizziness.

## 2023-06-28 NOTE — Discharge Instructions (Addendum)
 Your exam shows that you have a problem with your right inner ear causing your symptoms.  Make sure you increase your fluid intake to 2 L of water a day.  Try and drink some extra electrolyte containing fluids such as Pedialyte or Gatorade.  Do the Epley maneuver on the right.  This will help fix the underlying cause.  If the handout is confusing, will try watching a few videos on YouTube.

## 2023-06-28 NOTE — ED Provider Notes (Signed)
 HPI  SUBJECTIVE:  Sandra Dudley is a 73 y.o. female who presents with multiple episodes of dizziness described as "the room spinning around me" lasting seconds starting yesterday.  She states that she feels off balance, but has not fallen.  She denies chest pain, shortness of breath, palpitations, tinnitus, headache, visual changes, generalized weakness, slurred speech, aphasia, numbness/tingling/weakness in her arm, face, leg, ear pain/fullness, abdominal pain, recent viral illnesses.  No recent change in her medications. She has had symptoms like this before which was thought to be peripheral vertigo and responded well to meclizine .  She has had normal p.o. intake.  States she drinks approximately 1.5 L of water a day.  she has not tried anything for symptoms.  Symptoms resolve on their own.  They are worse with going from sitting to standing.   She has a past medical history of vertigo, cardiomyopathy, hypertension, hyperlipidemia, anxiety.no history of CVA, MI, Mnire's, labyrinthitis, orthostatic hypotension, diabetes.  [facility]   Past Medical History:  Diagnosis Date   Anxiety    Arthritis    Cancer (HCC)    skin   Chronic pain    Enlarged heart    High cholesterol    Hypertension     Past Surgical History:  Procedure Laterality Date   ABDOMINAL HYSTERECTOMY     BACK SURGERY     KNEE SURGERY     SHOULDER SURGERY      Family History  Problem Relation Age of Onset   Hypertension Mother    Cancer Father     Social History   Tobacco Use   Smoking status: Never   Smokeless tobacco: Never  Vaping Use   Vaping status: Never Used  Substance Use Topics   Alcohol use: No   Drug use: No    No current facility-administered medications for this encounter.  Current Outpatient Medications:    albuterol  (PROVENTIL  HFA;VENTOLIN  HFA) 108 (90 Base) MCG/ACT inhaler, Inhale 1 puff into the lungs every 6 (six) hours as needed for wheezing or shortness of breath.,  Disp: , Rfl:    ALPRAZolam  (XANAX ) 0.25 MG tablet, Take 1 tablet (0.25 mg total) by mouth 2 (two) times daily as needed., Disp: 60 tablet, Rfl: 0   budesonide-formoterol (SYMBICORT) 80-4.5 MCG/ACT inhaler, Inhale 2 puffs into the lungs 2 (two) times daily., Disp: , Rfl:    fenofibrate (TRICOR) 145 MG tablet, Take 145 mg by mouth daily., Disp: , Rfl:    lisinopril -hydrochlorothiazide  (ZESTORETIC ) 20-12.5 MG tablet, Take 1 tablet by mouth daily., Disp: 90 tablet, Rfl: 3   meclizine  (ANTIVERT ) 25 MG tablet, Take 1 tablet (25 mg total) by mouth 3 (three) times daily as needed for dizziness., Disp: 30 tablet, Rfl: 0   omeprazole (PRILOSEC) 40 MG capsule, TAKE 1 CAPSULE BY MOUTH ONCE DAILY, Disp: 90 capsule, Rfl: 3   rosuvastatin (CRESTOR) 10 MG tablet, Take 10 mg by mouth at bedtime., Disp: , Rfl:    venlafaxine (EFFEXOR) 75 MG tablet, Take 75 mg by mouth 2 (two) times daily., Disp: , Rfl:   Allergies  Allergen Reactions   Prednisone  Nausea Only   Codeine Nausea Only   Penicillins Rash     ROS  As noted in HPI.   Physical Exam  BP 122/60 (BP Location: Left Arm)   Pulse 68   Temp 98.2 F (36.8 C) (Oral)   Resp 15   SpO2 100%   Orthostatic VS for the past 24 hrs (Last 3 readings):  BP- Lying Pulse-  Lying BP- Sitting Pulse- Sitting BP- Standing at 0 minutes Pulse- Standing at 0 minutes BP- Standing at 3 minutes Pulse- Standing at 3 minutes  06/28/23 1500 125/55 70 122/60 68 110/50 75 98/59 79     Constitutional: Well developed, well nourished, no acute distress Eyes: PERRL, EOMI, conjunctiva normal bilaterally HENT: Normocephalic, atraumatic,mucus membranes moist.  TMs normal bilaterally. Respiratory: Normal inspiratory effort Cardiovascular: Normal rate and rhythm, no murmurs, no gallops, no rubs.  No carotid bruit. GI: Nondistended skin: No rash, skin intact Musculoskeletal: No edema, no tenderness, no deformities Neurologic: Alert & oriented x 3, CN III-XII intact, no motor  deficits, sensation grossly intact.  Finger-nose, heel shin within normal limits.  Speech fluent.  Romberg negative.  Patient has difficulty performing tandem gait, but states that this is baseline.  Positive Dix-Hallpike on the right. Psychiatric: Speech and behavior appropriate   ED Course   Medications - No data to display  Orders Placed This Encounter  Procedures   Glucose, capillary    Standing Status:   Standing    Number of Occurrences:   1   CBG monitoring, ED    Standing Status:   Standing    Number of Occurrences:   1   ED EKG    Standing Status:   Standing    Number of Occurrences:   1    Reason for Exam:   Weakness   EKG 12-Lead    Standing Status:   Standing    Number of Occurrences:   1   Results for orders placed or performed during the hospital encounter of 06/28/23 (from the past 24 hours)  Glucose, capillary     Status: Abnormal   Collection Time: 06/28/23  3:34 PM  Result Value Ref Range   Glucose-Capillary 108 (H) 70 - 99 mg/dL   No results found.  ED Clinical Impression  1. Peripheral vertigo involving right ear   2. Orthostasis      ED Assessment/Plan    Outside records reviewed.  Additional history obtained.  As noted in HPI.  EKG: Normal sinus rhythm, rate 67.  Normal axis, normal intervals.  No hypertrophy.  No ST or T wave changes.    Presentation consistent with a peripheral vertigo on the right side.  She is also somewhat orthostatic.  Will send home with meclizine , Epley maneuver on the right, advised increased fluid intake as she states she drinks only 1.5 L of water a day.  Advised her to drink at least 2 L of water a day, and to add some electrolyte containing fluids with it.  She has follow-up with her PCP next week.  Strict ER return precautions given.  Discussed labs, imaging, MDM, treatment plan, and plan for follow-up with patient Discussed sn/sx that should prompt return to the ED. patient agrees with plan.   Meds ordered this  encounter  Medications   meclizine  (ANTIVERT ) 25 MG tablet    Sig: Take 1 tablet (25 mg total) by mouth 3 (three) times daily as needed for dizziness.    Dispense:  30 tablet    Refill:  0      *This clinic note was created using Scientist, clinical (histocompatibility and immunogenetics). Therefore, there may be occasional mistakes despite careful proofreading. ?    Ethlyn Herd, MD 06/29/23 1456

## 2023-08-05 ENCOUNTER — Other Ambulatory Visit: Payer: Self-pay | Admitting: Internal Medicine

## 2023-08-05 DIAGNOSIS — R7303 Prediabetes: Secondary | ICD-10-CM | POA: Diagnosis not present

## 2023-08-05 DIAGNOSIS — Z79899 Other long term (current) drug therapy: Secondary | ICD-10-CM | POA: Diagnosis not present

## 2023-08-05 DIAGNOSIS — M25511 Pain in right shoulder: Secondary | ICD-10-CM | POA: Diagnosis not present

## 2023-08-05 DIAGNOSIS — E785 Hyperlipidemia, unspecified: Secondary | ICD-10-CM | POA: Diagnosis not present

## 2023-08-05 DIAGNOSIS — Z0000 Encounter for general adult medical examination without abnormal findings: Secondary | ICD-10-CM | POA: Diagnosis not present

## 2023-08-05 DIAGNOSIS — I10 Essential (primary) hypertension: Secondary | ICD-10-CM | POA: Diagnosis not present

## 2023-08-05 DIAGNOSIS — Z1231 Encounter for screening mammogram for malignant neoplasm of breast: Secondary | ICD-10-CM | POA: Diagnosis not present

## 2023-08-05 DIAGNOSIS — F419 Anxiety disorder, unspecified: Secondary | ICD-10-CM | POA: Diagnosis not present

## 2023-08-05 DIAGNOSIS — Z1331 Encounter for screening for depression: Secondary | ICD-10-CM | POA: Diagnosis not present

## 2023-11-18 DIAGNOSIS — M79671 Pain in right foot: Secondary | ICD-10-CM | POA: Diagnosis not present

## 2023-11-18 DIAGNOSIS — Z2821 Immunization not carried out because of patient refusal: Secondary | ICD-10-CM | POA: Diagnosis not present

## 2023-11-18 DIAGNOSIS — M79672 Pain in left foot: Secondary | ICD-10-CM | POA: Diagnosis not present

## 2023-12-02 NOTE — Progress Notes (Signed)
 Sandra Dudley                                          MRN: [id number]   12/02/2023   The [facility] Quality Team Specialist reviewed this patient medical record for the purposes of chart review for care gap closure. The following were reviewed: abstraction for care gap closure-care for older adult medication review.    [facility] Quality Team

## 2024-02-29 ENCOUNTER — Other Ambulatory Visit: Payer: Self-pay | Admitting: Internal Medicine

## 2024-02-29 DIAGNOSIS — Z0000 Encounter for general adult medical examination without abnormal findings: Secondary | ICD-10-CM

## 2024-02-29 DIAGNOSIS — R42 Dizziness and giddiness: Secondary | ICD-10-CM

## 2024-03-07 ENCOUNTER — Other Ambulatory Visit

## 2024-03-07 ENCOUNTER — Ambulatory Visit
